# Patient Record
Sex: Male | Born: 1980
Health system: Southern US, Community
[De-identification: ages and names within clinical notes are randomized; demographics above are authoritative.]

## PROBLEM LIST (undated history)

## (undated) DIAGNOSIS — Z889 Allergy status to unspecified drugs, medicaments and biological substances status: Secondary | ICD-10-CM

## (undated) DIAGNOSIS — J301 Allergic rhinitis due to pollen: Secondary | ICD-10-CM

## (undated) DIAGNOSIS — J45909 Unspecified asthma, uncomplicated: Secondary | ICD-10-CM

## (undated) DIAGNOSIS — B019 Varicella without complication: Secondary | ICD-10-CM

## (undated) DIAGNOSIS — G4733 Obstructive sleep apnea (adult) (pediatric): Secondary | ICD-10-CM

## (undated) HISTORY — PX: TYMPANOSTOMY TUBE PLACEMENT: SHX32

## (undated) HISTORY — DX: Allergic rhinitis due to pollen: J30.1

## (undated) HISTORY — PX: ADENOIDECTOMY: SUR15

## (undated) HISTORY — DX: Allergy status to unspecified drugs, medicaments and biological substances: Z88.9

## (undated) HISTORY — DX: Varicella without complication: B01.9

## (undated) HISTORY — DX: Obstructive sleep apnea (adult) (pediatric): G47.33

## (undated) HISTORY — DX: Unspecified asthma, uncomplicated: J45.909

---

## 2012-10-04 LAB — CBC AND DIFFERENTIAL
HCT: 46 % (ref 41–53)
HEMOGLOBIN: 16.9 g/dL (ref 13.5–17.5)
Platelets: 234 10*3/uL (ref 150–399)
WBC: 6.6 10^3/mL

## 2012-10-04 LAB — HEPATIC FUNCTION PANEL
ALT: 25 U/L (ref 10–40)
AST: 19 U/L (ref 14–40)
Alkaline Phosphatase: 52 U/L (ref 25–125)
Bilirubin, Total: 1.3 mg/dL

## 2012-10-04 LAB — BASIC METABOLIC PANEL
BUN: 17 mg/dL (ref 4–21)
Creatinine: 0.9 mg/dL (ref 0.6–1.3)
POTASSIUM: 3.9 mmol/L (ref 3.4–5.3)
SODIUM: 139 mmol/L (ref 137–147)

## 2012-10-04 LAB — LIPID PANEL
CHOLESTEROL: 183 mg/dL (ref 0–200)
HDL: 38 mg/dL (ref 35–70)
LDL CALC: 125 mg/dL
Triglycerides: 102 mg/dL (ref 40–160)

## 2014-07-25 LAB — LIPID PANEL
Cholesterol: 143 mg/dL (ref 0–200)
HDL: 35 mg/dL (ref 35–70)
LDL CALC: 94 mg/dL
TRIGLYCERIDES: 68 mg/dL (ref 40–160)

## 2014-07-25 LAB — BASIC METABOLIC PANEL: GLUCOSE: 96 mg/dL

## 2015-09-02 ENCOUNTER — Encounter: Payer: Self-pay | Admitting: Family Medicine

## 2015-09-02 ENCOUNTER — Ambulatory Visit (INDEPENDENT_AMBULATORY_CARE_PROVIDER_SITE_OTHER): Payer: 59 | Admitting: Family Medicine

## 2015-09-02 VITALS — BP 122/80 | HR 70 | Temp 98.1°F | Ht 72.5 in | Wt 199.1 lb

## 2015-09-02 DIAGNOSIS — F900 Attention-deficit hyperactivity disorder, predominantly inattentive type: Secondary | ICD-10-CM | POA: Diagnosis not present

## 2015-09-02 DIAGNOSIS — Z0000 Encounter for general adult medical examination without abnormal findings: Secondary | ICD-10-CM

## 2015-09-02 DIAGNOSIS — J4520 Mild intermittent asthma, uncomplicated: Secondary | ICD-10-CM | POA: Diagnosis not present

## 2015-09-02 DIAGNOSIS — J45909 Unspecified asthma, uncomplicated: Secondary | ICD-10-CM | POA: Insufficient documentation

## 2015-09-02 DIAGNOSIS — F988 Other specified behavioral and emotional disorders with onset usually occurring in childhood and adolescence: Secondary | ICD-10-CM | POA: Insufficient documentation

## 2015-09-02 NOTE — Patient Instructions (Signed)
It was nice to see you today.  Follow up annually.  I will refill your medication once I get the records.  Take care  Dr. Adriana Simas

## 2015-09-02 NOTE — Progress Notes (Signed)
Pre visit review using our clinic review tool, if applicable. No additional management support is needed unless otherwise documented below in the visit note. 

## 2015-09-02 NOTE — Assessment & Plan Note (Signed)
Will obtain records from PCP including labs. Patient declined flu shot, and Pneumovax today. Remainder of preventative healthcare up to date.

## 2015-09-02 NOTE — Assessment & Plan Note (Signed)
Patient has recently relocated from [place]. He has had no issues with his asthma since relocating here. Currently stable with PRN Albuterol.

## 2015-09-02 NOTE — Progress Notes (Signed)
Subjective:  Patient ID: Tyler Hobbs, male    DOB: 1981/06/01  Age: 34 y.o. MRN: [id number]  CC: Establish care  HPI Tyler Hobbs is a 34 y.o. male presents to the clinic today to establish care.  Preventative Healthcare  Immunizations: Declined flu shot and Pneumovax today.  Labs: Patient reports that he had labs done by his PCP less than a year ago. Will obtain records.  Exercise - Exercises regularly.   Alcohol use: See below.  Smoking/tobacco use: Former smoker.   STD/HIV testing: Not indicated.  Regular dental exams: Yes.   Wears seat belt: Yes.   PMH, Surgical Hx, Family Hx, Social History reviewed and updated as below. Past Medical History  Diagnosis Date  . Asthma   . Hx of bronchitis   . Chicken pox   . Hay fever   . Multiple allergies    Past Surgical History  Procedure Laterality Date  . Adenoidectomy    . Tympanostomy tube placement     No family history on file.  Social History  Substance Use Topics  . Smoking status: Former Smoker    Quit date: 09/01/2001  . Smokeless tobacco: Never Used  . Alcohol Use: 1.8 - 2.4 oz/week    3-4 Standard drinks or equivalent per week     Comment: both beer and liquor   Review of Systems  HENT:       Allergies.  All other systems negative.   Objective:   Today's Vitals: BP 122/80 mmHg  Pulse 70  Temp(Src) 98.1 F (36.7 C) (Oral)  Ht 6' 0.5" (1.842 m)  Wt 199 lb 2 oz (90.323 kg)  BMI 26.62 kg/m2  SpO2 97%  Physical Exam  Constitutional: He is oriented to person, place, and time. He appears well-developed and well-nourished. No distress.  HENT:  Head: Normocephalic and atraumatic.  Nose: Nose normal.  Mouth/Throat: Oropharynx is clear and moist. No oropharyngeal exudate.  Normal TM's bilaterally.   Eyes: Conjunctivae are normal. No scleral icterus.  Neck: Neck supple. No thyromegaly present.  Cardiovascular: Normal rate and regular rhythm.   No murmur heard. Pulmonary/Chest: Effort  normal and breath sounds normal. He has no wheezes. He has no rales.  Abdominal: Soft. He exhibits no distension. There is no tenderness. There is no rebound and no guarding.  Musculoskeletal: Normal range of motion. He exhibits no edema.  Lymphadenopathy:    He has no cervical adenopathy.  Neurological: He is alert and oriented to person, place, and time.  Skin: Skin is warm and dry. No rash noted.  Psychiatric: He has a normal mood and affect.  Vitals reviewed.   Assessment & Plan:   Problem List Items Addressed This Visit    Adult attention deficit disorder    Patient reports a history of ADD. He states that he was not diagnosed and treated as a child given stigma that accompany diagnosis years ago. Patient reports he was diagnosed by psychiatrist in college and has been on treatment since then. His prior PCP has been prescribing ADD medication. I will await medical records before refilling his Ritalin.      Asthma, chronic    Patient has recently relocated from [place]. He has had no issues with his asthma since relocating here. Currently stable with PRN Albuterol.       Preventative health care - Primary    Will obtain records from PCP including labs. Patient declined flu shot, and Pneumovax today. Remainder of preventative healthcare up to date.  Outpatient Encounter Prescriptions as of 09/02/2015  Medication Sig  . fluticasone (FLONASE) 50 MCG/ACT nasal spray Place 1 spray into both nostrils as needed for allergies or rhinitis.  Marland Kitchen loratadine-pseudoephedrine (CLARITIN-D 24 HOUR) 10-240 MG per 24 hr tablet Take 1 tablet by mouth as needed for allergies.  . methylphenidate (RITALIN) 10 MG tablet Take 10 mg by mouth 3 (three) times daily.  . Multiple Vitamins-Minerals (MULTIVITAMIN GUMMIES ADULTS) CHEW Chew by mouth 2 (two) times daily.   No facility-administered encounter medications on file as of 09/02/2015.    Follow-up: 1 year  Tommie Sams DO

## 2015-09-02 NOTE — Assessment & Plan Note (Signed)
Patient reports a history of ADD. He states that he was not diagnosed and treated as a child given stigma that accompany diagnosis years ago. Patient reports he was diagnosed by psychiatrist in college and has been on treatment since then. His prior PCP has been prescribing ADD medication. I will await medical records before refilling his Ritalin.

## 2015-09-09 ENCOUNTER — Encounter: Payer: Self-pay | Admitting: Family Medicine

## 2015-09-27 ENCOUNTER — Encounter: Payer: Self-pay | Admitting: Family Medicine

## 2015-09-28 ENCOUNTER — Other Ambulatory Visit: Payer: Self-pay | Admitting: Family Medicine

## 2015-09-28 ENCOUNTER — Telehealth: Payer: Self-pay

## 2015-09-28 MED ORDER — METHYLPHENIDATE HCL 10 MG PO TABS: 10.0000 mg | ORAL_TABLET | Freq: Three times a day (TID) | ORAL | Status: DC

## 2015-09-28 NOTE — Telephone Encounter (Signed)
Pt was called to let him know that rx were ready to be picked up at the office.

## 2015-09-29 ENCOUNTER — Other Ambulatory Visit: Payer: Self-pay | Admitting: Family Medicine

## 2015-09-29 MED ORDER — METHYLPHENIDATE HCL 10 MG PO TABS: ORAL_TABLET | ORAL | Status: DC

## 2015-10-12 ENCOUNTER — Encounter: Payer: Self-pay | Admitting: Family Medicine

## 2015-11-25 ENCOUNTER — Encounter: Payer: Self-pay | Admitting: Family Medicine

## 2015-11-26 ENCOUNTER — Other Ambulatory Visit: Payer: Self-pay | Admitting: Family Medicine

## 2015-11-26 DIAGNOSIS — F909 Attention-deficit hyperactivity disorder, unspecified type: Secondary | ICD-10-CM

## 2015-12-03 ENCOUNTER — Encounter: Payer: Self-pay | Admitting: Family Medicine

## 2015-12-07 ENCOUNTER — Other Ambulatory Visit: Payer: Self-pay | Admitting: Family Medicine

## 2015-12-07 ENCOUNTER — Telehealth: Payer: Self-pay | Admitting: *Deleted

## 2015-12-07 ENCOUNTER — Encounter: Payer: Self-pay | Admitting: Family Medicine

## 2015-12-07 ENCOUNTER — Telehealth: Payer: Self-pay | Admitting: Family Medicine

## 2015-12-07 MED ORDER — METHYLPHENIDATE HCL 10 MG PO TABS: ORAL_TABLET | ORAL | Status: DC

## 2015-12-07 NOTE — Telephone Encounter (Signed)
Patient was called and told his prescription was ready for pick up at the office.

## 2015-12-30 ENCOUNTER — Encounter: Payer: Self-pay | Admitting: Family Medicine

## 2015-12-31 ENCOUNTER — Other Ambulatory Visit: Payer: Self-pay | Admitting: Family Medicine

## 2015-12-31 DIAGNOSIS — G4733 Obstructive sleep apnea (adult) (pediatric): Secondary | ICD-10-CM

## 2016-04-26 ENCOUNTER — Other Ambulatory Visit: Payer: Self-pay | Admitting: Family Medicine

## 2016-04-26 ENCOUNTER — Encounter: Payer: Self-pay | Admitting: Family Medicine

## 2016-04-29 ENCOUNTER — Other Ambulatory Visit: Payer: Self-pay | Admitting: Family Medicine

## 2016-04-29 MED ORDER — METHYLPHENIDATE HCL 10 MG PO TABS: ORAL_TABLET | ORAL | Status: DC

## 2016-04-29 NOTE — Telephone Encounter (Signed)
Pt wife called to follow up on the Rx for pt medication. Pls call when it's ready to [phone]. Thank you!

## 2016-07-06 ENCOUNTER — Encounter: Payer: Self-pay | Admitting: Family Medicine

## 2016-07-08 ENCOUNTER — Other Ambulatory Visit: Payer: Self-pay | Admitting: Family Medicine

## 2016-07-08 MED ORDER — METHYLPHENIDATE HCL 10 MG PO TABS: ORAL_TABLET | ORAL | Status: DC

## 2016-08-01 ENCOUNTER — Encounter: Payer: Self-pay | Admitting: Family Medicine

## 2016-08-11 ENCOUNTER — Encounter: Payer: Self-pay | Admitting: Family Medicine

## 2016-08-22 ENCOUNTER — Other Ambulatory Visit: Payer: Self-pay | Admitting: Family Medicine

## 2016-08-22 MED ORDER — METHYLPHENIDATE HCL 10 MG PO TABS: ORAL_TABLET | ORAL | 0 refills | Status: DC

## 2016-09-13 ENCOUNTER — Telehealth: Payer: Self-pay | Admitting: *Deleted

## 2016-09-13 NOTE — Telephone Encounter (Signed)
[employer] requested an updated Rx for [employer]ol  Fax [phone]

## 2016-09-13 NOTE — Telephone Encounter (Signed)
[employer] has requested to have a updater Rx for methylphenidate from 90 to 120 sent to cover my meds. Fax [phone]

## 2016-09-13 NOTE — Telephone Encounter (Signed)
This was done already for that number of pills I am not sure what else I can do? Thanks I never got a response to if it was approved or not.

## 2016-09-13 NOTE — Telephone Encounter (Signed)
Please clarify this message.

## 2016-09-14 ENCOUNTER — Other Ambulatory Visit: Payer: Self-pay | Admitting: Family Medicine

## 2016-09-14 MED ORDER — METHYLPHENIDATE HCL 10 MG PO TABS: ORAL_TABLET | ORAL | 0 refills | Status: DC

## 2016-09-14 NOTE — Telephone Encounter (Signed)
Refilled 08/22/16. Pt last seen 09/02/15. Please advise if pt needs to come in for a visit?

## 2016-09-14 NOTE — Telephone Encounter (Signed)
Okay; thanks.

## 2016-09-15 NOTE — Telephone Encounter (Signed)
Placed up front for pickup ° °

## 2016-10-19 ENCOUNTER — Other Ambulatory Visit: Payer: Self-pay | Admitting: Family Medicine

## 2016-10-19 MED ORDER — METHYLPHENIDATE HCL 10 MG PO TABS: ORAL_TABLET | ORAL | 0 refills | Status: DC

## 2016-10-19 NOTE — Telephone Encounter (Signed)
MyChart message sent to Pt.

## 2016-10-19 NOTE — Telephone Encounter (Signed)
Last filled 04/29/16. Pt has not been seen since 08/2015. Pt has no follow up visit scheduled.

## 2016-10-26 MED FILL — METHYLPHENIDATE 10 MG TAB: 10 | 30 days supply | Qty: 120 | Fill #0

## 2016-11-07 ENCOUNTER — Encounter: Payer: Self-pay | Admitting: Family Medicine

## 2016-11-14 ENCOUNTER — Ambulatory Visit (INDEPENDENT_AMBULATORY_CARE_PROVIDER_SITE_OTHER): Payer: 59 | Admitting: Family Medicine

## 2016-11-14 ENCOUNTER — Encounter: Payer: Self-pay | Admitting: Family Medicine

## 2016-11-14 VITALS — BP 129/89 | HR 78 | Temp 98.1°F | Resp 14 | Wt 199.4 lb

## 2016-11-14 DIAGNOSIS — G4733 Obstructive sleep apnea (adult) (pediatric): Secondary | ICD-10-CM | POA: Insufficient documentation

## 2016-11-14 DIAGNOSIS — Z0000 Encounter for general adult medical examination without abnormal findings: Secondary | ICD-10-CM

## 2016-11-14 MED ORDER — METHYLPHENIDATE HCL 10 MG PO TABS: ORAL_TABLET | ORAL | 0 refills | Status: DC

## 2016-11-14 NOTE — Patient Instructions (Addendum)
Follow up annually.  Take care  Dr. Cassidie Veiga   Health Maintenance, Male A healthy lifestyle and preventative care can promote health and wellness.  Maintain regular health, dental, and eye exams.  Eat a healthy diet. Foods like vegetables, fruits, whole grains, low-fat dairy products, and lean protein foods contain the nutrients you need and are low in calories. Decrease your intake of foods high in solid fats, added sugars, and salt. Get information about a proper diet from your health care provider, if necessary.  Regular physical exercise is one of the most important things you can do for your health. Most adults should get at least 150 minutes of moderate-intensity exercise (any activity that increases your heart rate and causes you to sweat) each week. In addition, most adults need muscle-strengthening exercises on 2 or more days a week.   Maintain a healthy weight. The body mass index (BMI) is a screening tool to identify possible weight problems. It provides an estimate of body fat based on height and weight. Your health care provider can find your BMI and can help you achieve or maintain a healthy weight. For males 20 years and older:  A BMI below 18.5 is considered underweight.  A BMI of 18.5 to 24.9 is normal.  A BMI of 25 to 29.9 is considered overweight.  A BMI of 30 and above is considered obese.  Maintain normal blood lipids and cholesterol by exercising and minimizing your intake of saturated fat. Eat a balanced diet with plenty of fruits and vegetables. Blood tests for lipids and cholesterol should begin at age 20 and be repeated every 5 years. If your lipid or cholesterol levels are high, you are over age 50, or you are at high risk for heart disease, you may need your cholesterol levels checked more frequently.Ongoing high lipid and cholesterol levels should be treated with medicines if diet and exercise are not working.  If you smoke, find out from your health care  provider how to quit. If you do not use tobacco, do not start.  Lung cancer screening is recommended for adults aged 55-80 years who are at high risk for developing lung cancer because of a history of smoking. A yearly low-dose CT scan of the lungs is recommended for people who have at least a 30-pack-year history of smoking and are current smokers or have quit within the past 15 years. A pack year of smoking is smoking an average of 1 pack of cigarettes a day for 1 year (for example, a 30-pack-year history of smoking could mean smoking 1 pack a day for 30 years or 2 packs a day for 15 years). Yearly screening should continue until the smoker has stopped smoking for at least 15 years. Yearly screening should be stopped for people who develop a health problem that would prevent them from having lung cancer treatment.  If you choose to drink alcohol, do not have more than 2 drinks per day. One drink is considered to be 12 oz (360 mL) of beer, 5 oz (150 mL) of wine, or 1.5 oz (45 mL) of liquor.  Avoid the use of street drugs. Do not share needles with anyone. Ask for help if you need support or instructions about stopping the use of drugs.  High blood pressure causes heart disease and increases the risk of stroke. High blood pressure is more likely to develop in:  People who have blood pressure in the end of the normal range (100-139/85-89 mm Hg).  People who   are overweight or obese.  People who are African American.  If you are 18-39 years of age, have your blood pressure checked every 3-5 years. If you are 40 years of age or older, have your blood pressure checked every year. You should have your blood pressure measured twice-once when you are at a hospital or clinic, and once when you are not at a hospital or clinic. Record the average of the two measurements. To check your blood pressure when you are not at a hospital or clinic, you can use:  An automated blood pressure machine at a pharmacy.  A  home blood pressure monitor.  If you are 45-79 years old, ask your health care provider if you should take aspirin to prevent heart disease.  Diabetes screening involves taking a blood sample to check your fasting blood sugar level. This should be done once every 3 years after age 45 if you are at a normal weight and without risk factors for diabetes. Testing should be considered at a younger age or be carried out more frequently if you are overweight and have at least 1 risk factor for diabetes.  Colorectal cancer can be detected and often prevented. Most routine colorectal cancer screening begins at the age of 50 and continues through age 75. However, your health care provider may recommend screening at an earlier age if you have risk factors for colon cancer. On a yearly basis, your health care provider may provide home test kits to check for hidden blood in the stool. A small camera at the end of a tube may be used to directly examine the colon (sigmoidoscopy or colonoscopy) to detect the earliest forms of colorectal cancer. Talk to your health care provider about this at age 50 when routine screening begins. A direct exam of the colon should be repeated every 5-10 years through age 75, unless early forms of precancerous polyps or small growths are found.  People who are at an increased risk for hepatitis B should be screened for this virus. You are considered at high risk for hepatitis B if:  You were born in a country where hepatitis B occurs often. Talk with your health care provider about which countries are considered high risk.  Your parents were born in a high-risk country and you have not received a shot to protect against hepatitis B (hepatitis B vaccine).  You have HIV or AIDS.  You use needles to inject street drugs.  You live with, or have sex with, someone who has hepatitis B.  You are a man who has sex with other men (MSM).  You get hemodialysis treatment.  You take certain  medicines for conditions like cancer, organ transplantation, and autoimmune conditions.  Hepatitis C blood testing is recommended for all people born from 1945 through 1965 and any individual with known risk factors for hepatitis C.  Healthy men should no longer receive prostate-specific antigen (PSA) blood tests as part of routine cancer screening. Talk to your health care provider about prostate cancer screening.  Testicular cancer screening is not recommended for adolescents or adult males who have no symptoms. Screening includes self-exam, a health care provider exam, and other screening tests. Consult with your health care provider about any symptoms you have or any concerns you have about testicular cancer.  Practice safe sex. Use condoms and avoid high-risk sexual practices to reduce the spread of sexually transmitted infections (STIs).  You should be screened for STIs, including gonorrhea and chlamydia if:  You   are sexually active and are younger than 24 years.  You are older than 24 years, and your health care provider tells you that you are at risk for this type of infection.  Your sexual activity has changed since you were last screened, and you are at an increased risk for chlamydia or gonorrhea. Ask your health care provider if you are at risk.  If you are at risk of being infected with HIV, it is recommended that you take a prescription medicine daily to prevent HIV infection. This is called pre-exposure prophylaxis (PrEP). You are considered at risk if:  You are a man who has sex with other men (MSM).  You are a heterosexual man who is sexually active with multiple partners.  You take drugs by injection.  You are sexually active with a partner who has HIV.  Talk with your health care provider about whether you are at high risk of being infected with HIV. If you choose to begin PrEP, you should first be tested for HIV. You should then be tested every 3 months for as long as  you are taking PrEP.  Use sunscreen. Apply sunscreen liberally and repeatedly throughout the day. You should seek shade when your shadow is shorter than you. Protect yourself by wearing long sleeves, pants, a wide-brimmed hat, and sunglasses year round whenever you are outdoors.  Tell your health care provider of new moles or changes in moles, especially if there is a change in shape or color. Also, tell your health care provider if a mole is larger than the size of a pencil eraser.  A one-time screening for abdominal aortic aneurysm (AAA) and surgical repair of large AAAs by ultrasound is recommended for men aged 65-75 years who are current or former smokers.  Stay current with your vaccines (immunizations). This information is not intended to replace advice given to you by your health care provider. Make sure you discuss any questions you have with your health care provider. Document Released: 06/09/2008 Document Revised: 01/02/2015 Document Reviewed: 09/15/2015 Elsevier Interactive Patient Education  2017 [employer]  

## 2016-11-14 NOTE — Assessment & Plan Note (Signed)
Preventative healthcare up-to-date. Patient in need of new CPAP. Written prescription given today. Patient in need of refill on Ritalin. Refills given today.

## 2016-11-14 NOTE — Progress Notes (Signed)
Subjective:  Patient ID: Tyler Hobbs, male    DOB: 03/12/1981  Age: 35 y.o. MRN: [id number]  CC: Annual physical exam  HPI Tyler Hobbs is a 35 y.o. male presents to the clinic today for an annual physical exam.  Preventative Healthcare  Immunizations  Tetanus - Up to date.   Flu - Up to date.   Labs: Needs labs today.   Alcohol use: See below.  Smoking/tobacco use: Former.  PMH, Surgical Hx, Family Hx, Social History reviewed and updated as below.  Past Medical History:  Diagnosis Date  . Asthma   . Chicken pox   . Hay fever   . Multiple allergies   . OSA (obstructive sleep apnea)    Past Surgical History:  Procedure Laterality Date  . ADENOIDECTOMY    . TYMPANOSTOMY TUBE PLACEMENT     Family History  Problem Relation Age of Onset  . Hypertension Father   . Cancer Maternal Aunt     lung  . Cancer Paternal Aunt     breast  . Alcohol abuse Paternal Uncle   . Arthritis Maternal Grandfather    Social History  Substance Use Topics  . Smoking status: Former Smoker    Quit date: 09/01/2001  . Smokeless tobacco: Never Used  . Alcohol use 1.8 - 2.4 oz/week    3 - 4 Standard drinks or equivalent per week     Comment: both beer and liquor   Review of Systems  Respiratory:       OSA - on CPAP.  Psychiatric/Behavioral:       Concentration deficit, inattention - stable.   All other systems reviewed and are negative.   Objective:   Today's Vitals: BP 129/89 (BP Location: Left Arm, Patient Position: Sitting, Cuff Size: Normal)   Pulse 78   Temp 98.1 F (36.7 C) (Oral)   Resp 14   Wt 199 lb 6 oz (90.4 kg)   SpO2 100%   BMI 26.67 kg/m   Physical Exam  Constitutional: He is oriented to person, place, and time. He appears well-developed and well-nourished. No distress.  HENT:  Head: Normocephalic and atraumatic.  Nose: Nose normal.  Mouth/Throat: Oropharynx is clear and moist. No oropharyngeal exudate.  Normal TM's bilaterally.   Eyes:  Conjunctivae are normal. No scleral icterus.  Neck: Neck supple.  Cardiovascular: Normal rate and regular rhythm.   No murmur heard. Pulmonary/Chest: Effort normal and breath sounds normal. He has no wheezes. He has no rales.  Abdominal: Soft. He exhibits no distension. There is no tenderness. There is no rebound and no guarding.  Musculoskeletal: Normal range of motion. He exhibits no edema.  Lymphadenopathy:    He has no cervical adenopathy.  Neurological: He is alert and oriented to person, place, and time.  Skin: Skin is warm and dry. No rash noted.  Psychiatric: He has a normal mood and affect.  Vitals reviewed.  Assessment & Plan:   Problem List Items Addressed This Visit    Annual physical exam - Primary    Preventative healthcare up-to-date. Patient in need of new CPAP. Written prescription given today. Patient in need of refill on Ritalin. Refills given today.      Relevant Orders   CBC   Hemoglobin A1c   Lipid panel   Comprehensive metabolic panel      Outpatient Encounter Prescriptions as of 11/14/2016  Medication Sig  . fluticasone (FLONASE) 50 MCG/ACT nasal spray Place 1 spray into both nostrils as needed for allergies or  rhinitis.  Marland Kitchen. loratadine-pseudoephedrine (CLARITIN-D 24 HOUR) 10-240 MG per 24 hr tablet Take 1 tablet by mouth as needed for allergies.  . methylphenidate (RITALIN) 10 MG tablet 1.5 tablets in the am, 1.5 tablets at noon, and 1 tablet in the evening.  . methylphenidate (RITALIN) 10 MG tablet 1.5 tablets in the morning, 1.5 tablets at noon and 1 tablet in the evening.  . methylphenidate (RITALIN) 10 MG tablet 1.5 tablets in the morning, 1.5 tablets at noon and 1 tablet in the evening.  . Multiple Vitamins-Minerals (MULTIVITAMIN GUMMIES ADULTS) CHEW Chew by mouth 2 (two) times daily.  . [DISCONTINUED] methylphenidate (RITALIN) 10 MG tablet 1.5 tablets in the am, 1.5 tablets at noon, and 1 tablet in the evening.  . [DISCONTINUED] methylphenidate  (RITALIN) 10 MG tablet 1.5 tablets in the morning, 1.5 tablets at noon and 1 tablet in the evening.  . [DISCONTINUED] methylphenidate (RITALIN) 10 MG tablet 1.5 tablets in the morning, 1.5 tablets at noon and 1 tablet in the evening.   No facility-administered encounter medications on file as of 11/14/2016.     Follow-up: Annually  Everlene OtherJayce Ladale Sherburn DO [facility] [place]

## 2016-11-15 LAB — COMPREHENSIVE METABOLIC PANEL
ALBUMIN: 4.6 g/dL (ref 3.5–5.2)
ALT: 18 U/L (ref 0–53)
AST: 19 U/L (ref 0–37)
Alkaline Phosphatase: 44 U/L (ref 39–117)
BILIRUBIN TOTAL: 0.6 mg/dL (ref 0.2–1.2)
BUN: 20 mg/dL (ref 6–23)
CALCIUM: 9.7 mg/dL (ref 8.4–10.5)
CO2: 31 mEq/L (ref 19–32)
CREATININE: 0.92 mg/dL (ref 0.40–1.50)
Chloride: 102 mEq/L (ref 96–112)
GFR: 98.01 mL/min (ref >60.00)
Glucose, Bld: 102 mg/dL — ABNORMAL HIGH (ref 70–99)
Potassium: 4.2 mEq/L (ref 3.5–5.1)
Sodium: 139 mEq/L (ref 135–145)
Total Protein: 7 g/dL (ref 6.0–8.3)

## 2016-11-15 LAB — CBC
HCT: 45.1 % (ref 39.0–52.0)
HEMOGLOBIN: 15.6 g/dL (ref 13.0–17.0)
MCHC: 34.6 g/dL (ref 30.0–36.0)
MCV: 91.9 fl (ref 78.0–100.0)
PLATELETS: 217 10*3/uL (ref 150.0–400.0)
RBC: 4.91 Mil/uL (ref 4.22–5.81)
RDW: 12.7 % (ref 11.5–15.5)
WBC: 8.5 10*3/uL (ref 4.0–10.5)

## 2016-11-15 LAB — LIPID PANEL
CHOLESTEROL: 166 mg/dL (ref 0–200)
HDL: 45.6 mg/dL (ref >39.00)
LDL CALC: 98 mg/dL (ref 0–99)
NonHDL: 120.11
TRIGLYCERIDES: 111 mg/dL (ref 0.0–149.0)
Total CHOL/HDL Ratio: 4
VLDL: 22.2 mg/dL (ref 0.0–40.0)

## 2016-11-15 LAB — HEMOGLOBIN A1C: Hgb A1c MFr Bld: 5 % (ref 4.6–6.5)

## 2016-11-23 MED FILL — METHYLPHENIDATE 10 MG TAB: 10 | 30 days supply | Qty: 120 | Fill #0

## 2016-11-28 ENCOUNTER — Encounter: Payer: Self-pay | Admitting: Family Medicine

## 2016-12-21 MED FILL — METHYLPHENIDATE 10 MG TAB: 10 | 30 days supply | Qty: 120 | Fill #0

## 2017-01-18 MED FILL — METHYLPHENIDATE 10 MG TAB: 10 | 30 days supply | Qty: 120 | Fill #0

## 2017-02-07 ENCOUNTER — Other Ambulatory Visit: Payer: Self-pay | Admitting: Family Medicine

## 2017-02-09 MED ORDER — METHYLPHENIDATE HCL 10 MG PO TABS: ORAL_TABLET | ORAL | 0 refills | Status: DC

## 2017-02-09 NOTE — Telephone Encounter (Signed)
MyChart message sent to pt letting him know rx was ready.

## 2017-02-09 NOTE — Telephone Encounter (Signed)
Refilled 11/14/16. Pt last seen 11/14/16. Please advise? 

## 2017-02-15 MED FILL — METHYLPHENIDATE 10 MG TAB: 10 | 30 days supply | Qty: 120 | Fill #0

## 2017-02-21 ENCOUNTER — Telehealth: Payer: Self-pay | Admitting: Family Medicine

## 2017-02-21 NOTE — Telephone Encounter (Signed)
Pt sent a e-mail on 02/16/17 stating that a PA would be required come the month of March for his ritalin. Pt faxed over PA form. It was completed 02/16/17. PA decision was received today 02/21/17 and it was approved from 02/20/17-02/19/2018. Pt was notified via his MyChart.

## 2017-03-08 ENCOUNTER — Other Ambulatory Visit: Payer: Self-pay | Admitting: Family Medicine

## 2017-03-09 MED ORDER — METHYLPHENIDATE HCL 10 MG PO TABS: ORAL_TABLET | ORAL | 0 refills | Status: DC

## 2017-03-09 NOTE — Telephone Encounter (Signed)
Refilled: 02/09/17 Last OV: 11/14/16 Last Labs: 11/14/16 Future OV: none Please advise?

## 2017-03-09 NOTE — Telephone Encounter (Signed)
MyChart message sent to pt

## 2017-03-15 MED FILL — METHYLPHENIDATE 10 MG TAB: 10 | 30 days supply | Qty: 120 | Fill #0

## 2017-04-10 ENCOUNTER — Other Ambulatory Visit: Payer: Self-pay | Admitting: Family Medicine

## 2017-04-10 ENCOUNTER — Encounter: Payer: Self-pay | Admitting: Family Medicine

## 2017-04-10 MED ORDER — METHYLPHENIDATE HCL 10 MG PO TABS: ORAL_TABLET | ORAL | 0 refills | Status: DC

## 2017-04-10 NOTE — Telephone Encounter (Signed)
Refilled: 02/2017 Last OV: 11/14/16 Last Labs: 11/14/16 Future OV: none Please advise?

## 2017-04-10 NOTE — Telephone Encounter (Signed)
Pt notified via My Chart

## 2017-04-12 MED FILL — METHYLPHENIDATE 10 MG TAB: 10 | 30 days supply | Qty: 120 | Fill #0

## 2017-05-01 ENCOUNTER — Other Ambulatory Visit: Payer: Self-pay | Admitting: Family Medicine

## 2017-05-01 MED ORDER — METHYLPHENIDATE HCL 10 MG PO TABS: ORAL_TABLET | ORAL | 0 refills | Status: DC

## 2017-05-01 NOTE — Telephone Encounter (Signed)
MyChart message sent to pt to pick up at office.

## 2017-05-01 NOTE — Telephone Encounter (Signed)
Refilled: 03/31/17 Last OV: 11/14/16 Last Labs: 11/14/16 Future OV: none Please advise?

## 2017-05-12 MED FILL — METHYLPHENIDATE 10 MG TAB: 10 | 30 days supply | Qty: 120 | Fill #0

## 2017-07-12 MED FILL — METHYLPHENIDATE 10 MG TAB: 10 | 30 days supply | Qty: 120 | Fill #0

## 2017-07-31 ENCOUNTER — Other Ambulatory Visit: Payer: Self-pay | Admitting: Family Medicine

## 2017-07-31 NOTE — Telephone Encounter (Signed)
Last rx's given on 05/01/17, for three months worth, please advise, thanks

## 2017-08-01 ENCOUNTER — Other Ambulatory Visit: Payer: Self-pay | Admitting: Family Medicine

## 2017-08-01 MED ORDER — METHYLPHENIDATE HCL 10 MG PO TABS: ORAL_TABLET | ORAL | 0 refills | Status: DC

## 2017-08-03 ENCOUNTER — Other Ambulatory Visit: Payer: Self-pay | Admitting: Family Medicine

## 2017-08-03 ENCOUNTER — Encounter: Payer: Self-pay | Admitting: Family Medicine

## 2017-08-09 MED FILL — METHYLPHENIDATE 10 MG TAB: 10 | 30 days supply | Qty: 120 | Fill #0

## 2017-09-26 ENCOUNTER — Encounter: Payer: Self-pay | Admitting: Family Medicine

## 2017-10-17 ENCOUNTER — Encounter: Payer: Self-pay | Admitting: Family

## 2017-10-17 ENCOUNTER — Ambulatory Visit (INDEPENDENT_AMBULATORY_CARE_PROVIDER_SITE_OTHER): Payer: BLUE CROSS/BLUE SHIELD | Admitting: Family

## 2017-10-17 VITALS — BP 130/80 | HR 71 | Temp 98.1°F | Ht 72.0 in | Wt 203.0 lb

## 2017-10-17 DIAGNOSIS — Z0000 Encounter for general adult medical examination without abnormal findings: Secondary | ICD-10-CM

## 2017-10-17 DIAGNOSIS — F988 Other specified behavioral and emotional disorders with onset usually occurring in childhood and adolescence: Secondary | ICD-10-CM

## 2017-10-17 DIAGNOSIS — G4733 Obstructive sleep apnea (adult) (pediatric): Secondary | ICD-10-CM

## 2017-10-17 MED ORDER — METHYLPHENIDATE HCL 10 MG PO TABS: ORAL_TABLET | ORAL | 0 refills | Status: DC

## 2017-10-17 NOTE — Assessment & Plan Note (Addendum)
Declines testicular exam in the absence of symptoms.screening labs to be done at work. Encouraged continued exercise.

## 2017-10-17 NOTE — Progress Notes (Signed)
Pre visit review using our clinic review tool, if applicable. No additional management support is needed unless otherwise documented below in the visit note. 

## 2017-10-17 NOTE — Assessment & Plan Note (Signed)
Compliant with cipap.  

## 2017-10-17 NOTE — Patient Instructions (Addendum)
Physical  Labs-   Lipid panel, CMP, CBC, TSH, Vitamin D , A1C.   Last tetanus vaccine - confirm 2012?   Follow up 6 months   Health Maintenance, Male A healthy lifestyle and preventive care is important for your health and wellness. Ask your health care provider about what schedule of regular examinations is right for you. What should I know about weight and diet? Eat a Healthy Diet  Eat plenty of vegetables, fruits, whole grains, low-fat dairy products, and lean protein.  Do not eat a lot of foods high in solid fats, added sugars, or salt.  Maintain a Healthy Weight Regular exercise can help you achieve or maintain a healthy weight. You should:  Do at least 150 minutes of exercise each week. The exercise should increase your heart rate and make you sweat (moderate-intensity exercise).  Do strength-training exercises at least twice a week.  Watch Your Levels of Cholesterol and Blood Lipids  Have your blood tested for lipids and cholesterol every 5 years starting at 38 years of age. If you are at high risk for heart disease, you should start having your blood tested when you are 38 years old. You may need to have your cholesterol levels checked more often if: ? Your lipid or cholesterol levels are high. ? You are older than 38 years of age. ? You are at high risk for heart disease.  What should I know about cancer screening? Many types of cancers can be detected early and may often be prevented. Lung Cancer  You should be screened every year for lung cancer if: ? You are a current smoker who has smoked for at least 30 years. ? You are a former smoker who has quit within the past 15 years.  Talk to your health care provider about your screening options, when you should start screening, and how often you should be screened.  Colorectal Cancer  Routine colorectal cancer screening usually begins at 38 years of age and should be repeated every 5-10 years until you are 38 years  old. You may need to be screened more often if early forms of precancerous polyps or small growths are found. Your health care provider may recommend screening at an earlier age if you have risk factors for colon cancer.  Your health care provider may recommend using home test kits to check for hidden blood in the stool.  A small camera at the end of a tube can be used to examine your colon (sigmoidoscopy or colonoscopy). This checks for the earliest forms of colorectal cancer.  Prostate and Testicular Cancer  Depending on your age and overall health, your health care provider may do certain tests to screen for prostate and testicular cancer.  Talk to your health care provider about any symptoms or concerns you have about testicular or prostate cancer.  Skin Cancer  Check your skin from head to toe regularly.  Tell your health care provider about any new moles or changes in moles, especially if: ? There is a change in a mole's size, shape, or color. ? You have a mole that is larger than a pencil eraser.  Always use sunscreen. Apply sunscreen liberally and repeat throughout the day.  Protect yourself by wearing long sleeves, pants, a wide-brimmed hat, and sunglasses when outside.  What should I know about heart disease, diabetes, and high blood pressure?  If you are 60-52 years of age, have your blood pressure checked every 3-5 years. If you are 40 years  of age or older, have your blood pressure checked every year. You should have your blood pressure measured twice-once when you are at a hospital or clinic, and once when you are not at a hospital or clinic. Record the average of the two measurements. To check your blood pressure when you are not at a hospital or clinic, you can use: ? An automated blood pressure machine at a pharmacy. ? A home blood pressure monitor.  Talk to your health care provider about your target blood pressure.  If you are between 2045-38 years old, ask your  health care provider if you should take aspirin to prevent heart disease.  Have regular diabetes screenings by checking your fasting blood sugar level. ? If you are at a normal weight and have a low risk for diabetes, have this test once every three years after the age of 38. ? If you are overweight and have a high risk for diabetes, consider being tested at a younger age or more often.  A one-time screening for abdominal aortic aneurysm (AAA) by ultrasound is recommended for men aged 65-75 years who are current or former smokers. What should I know about preventing infection? Hepatitis B If you have a higher risk for hepatitis B, you should be screened for this virus. Talk with your health care provider to find out if you are at risk for hepatitis B infection. Hepatitis C Blood testing is recommended for:  Everyone born from [id number] through 1965.  Anyone with known risk factors for hepatitis C.  Sexually Transmitted Diseases (STDs)  You should be screened each year for STDs including gonorrhea and chlamydia if: ? You are sexually active and are younger than 38 years of age. ? You are older than 38 years of age and your health care provider tells you that you are at risk for this type of infection. ? Your sexual activity has changed since you were last screened and you are at an increased risk for chlamydia or gonorrhea. Ask your health care provider if you are at risk.  Talk with your health care provider about whether you are at high risk of being infected with HIV. Your health care provider may recommend a prescription medicine to help prevent HIV infection.  What else can I do?  Schedule regular health, dental, and eye exams.  Stay current with your vaccines (immunizations).  Do not use any tobacco products, such as cigarettes, chewing tobacco, and e-cigarettes. If you need help quitting, ask your health care provider.  Limit alcohol intake to no more than 2 drinks per day. One  drink equals 12 ounces of beer, 5 ounces of wine, or 1 ounces of hard liquor.  Do not use street drugs.  Do not share needles.  Ask your health care provider for help if you need support or information about quitting drugs.  Tell your health care provider if you often feel depressed.  Tell your health care provider if you have ever been abused or do not feel safe at home. This information is not intended to replace advice given to you by your health care provider. Make sure you discuss any questions you have with your health care provider. Document Released: 06/09/2008 Document Revised: 08/10/2016 Document Reviewed: 09/15/2015 Elsevier Interactive Patient Education  [employer]2018 Elsevier Inc.

## 2017-10-17 NOTE — Progress Notes (Signed)
Subjective:    Tyler Hobbs ID: Tyler Hobbs, male    DOB: [birth year], 36 y.o.   MRN: [id number]  CC: Tyler Hobbs is a 36 y.o. male who presents Hobbs for physical exam.    HPI: ADD- formally diagnosed in college and then again, [facility] 01/2015 Ritalin works well. No palpitations, chest  Pain, increased anxiety, trouble sleeping  Tyler Hobbs, no complaints Hobbs.   No cardiac history.   OSA- wearing cipap       Colorectal  Cancer Screening: No early family  History.  Prostate Cancer Screening: Has been discussed with Tyler Hobbs; Tyler Hobbs declined following PSA based on risks versus benefits. No prostate cancer family history.  Lung Cancer Screening: No 30 year pack year history and > 55 years.  Immunizations       Tetanus - unsure, thinks 2012         HIV Screening- Candidate for; declines Labs: Screening labs Hobbs. Exercise: Gets regular exercise.  Alcohol use: occasional Smoking/tobacco use: Nonsmoker.  Regular dental exams: UTD Wears seat belt: Yes. Skin: no new skin lesions; no h/o skin cancer   HISTORY:  Past Medical History:  Diagnosis Date  . Asthma   . Chicken pox   . Hay fever   . Multiple allergies   . OSA (obstructive sleep apnea)     Past Surgical History:  Procedure Laterality Date  . ADENOIDECTOMY    . TYMPANOSTOMY TUBE PLACEMENT     Family History  Problem Relation Age of Onset  . Hypertension Father   . Cancer Maternal Aunt        lung  . Cancer Paternal Aunt        breast  . Alcohol abuse Paternal Uncle   . Arthritis Maternal Grandfather   . Colon cancer Neg Hx   . Prostate cancer Neg Hx       ALLERGIES: Tyler Hobbs has no known allergies.  Current Outpatient Prescriptions on File Prior to Visit  Medication Sig Dispense Refill  . fluticasone (FLONASE) 50 MCG/ACT nasal spray Place 1 spray into both nostrils as needed for allergies or rhinitis.    Marland Kitchen loratadine-pseudoephedrine (CLARITIN-D 24 HOUR) 10-240 MG  per 24 hr tablet Take 1 tablet by mouth as needed for allergies.    . Multiple Vitamins-Minerals (MULTIVITAMIN GUMMIES ADULTS) CHEW Chew by mouth 2 (two) times daily.     No current facility-administered medications on file prior to visit.     Social History  Substance Use Topics  . Smoking status: Former Smoker    Quit date: 09/01/2001  . Smokeless tobacco: Never Used  . Alcohol use 1.8 - 2.4 oz/week    3 - 4 Standard drinks or equivalent per week     Comment: both beer and liquor    Review of Systems  Constitutional: Negative for chills and fever.  HENT: Negative for congestion.   Respiratory: Negative for cough.   Cardiovascular: Negative for chest pain, palpitations and leg swelling.  Gastrointestinal: Negative for diarrhea, nausea and vomiting.  Genitourinary: Negative for difficulty urinating, dysuria, hematuria and testicular pain.  Musculoskeletal: Negative for myalgias.  Skin: Negative for rash.  Neurological: Negative for headaches.  Hematological: Negative for adenopathy.  Psychiatric/Behavioral: Negative for confusion.      Objective:    BP 130/80   Pulse 71   Temp 98.1 F (36.7 C) (Oral)   Ht 6' (1.829 m)   Wt 203 lb (92.1 kg)   SpO2 98%   BMI 27.53 kg/m  BP Readings from Last 3 Encounters:  10/17/17 130/80  11/14/16 129/89  09/02/15 122/80   Wt Readings from Last 3 Encounters:  10/17/17 203 lb (92.1 kg)  11/14/16 199 lb 6 oz (90.4 kg)  09/02/15 199 lb 2 oz (90.3 kg)    Physical Exam  Constitutional: Tyler Hobbs appears well-developed and well-nourished.  Neck: No thyroid mass and no thyromegaly present.  Cardiovascular: Regular rhythm and normal heart sounds.   Pulmonary/Chest: Effort normal and breath sounds normal. No respiratory distress. Tyler Hobbs has no wheezes. Tyler Hobbs has no rhonchi. Tyler Hobbs has no rales.  Lymphadenopathy:       Head (right side): No submental, no submandibular, no tonsillar, no preauricular, no posterior auricular and no occipital adenopathy  present.       Head (left side): No submental, no submandibular, no tonsillar, no preauricular, no posterior auricular and no occipital adenopathy present.    Tyler Hobbs has no cervical adenopathy.    Tyler Hobbs has no axillary adenopathy.  Neurological: Tyler Hobbs is alert.  Skin: Skin is warm and dry.  Psychiatric: Tyler Hobbs has a normal mood and affect. His speech is normal and behavior is normal.  Vitals reviewed.      Assessment & Plan:   Problem List Items Addressed This Visit      Respiratory   OSA (obstructive sleep apnea)    Compliant with cipap.        Other   Adult attention deficit disorder    Tyler well on Ritalin. No side effectsTakes short-acting ritalin three times a day.refilled I looked up Tyler Hobbs on [place] Controlled Substances Reporting System and saw no activity that raised concern of inappropriate use.        Relevant Medications   methylphenidate (RITALIN) 10 MG tablet   methylphenidate (RITALIN) 10 MG tablet   methylphenidate (RITALIN) 10 MG tablet   Annual physical exam - Primary    Declines testicular exam in the absence of symptoms.screening labs to be done at work. Encouraged continued exercise.           I am having Tyler Hobbs maintain his fluticasone, loratadine-pseudoephedrine, MULTIVITAMIN GUMMIES ADULTS, methylphenidate, methylphenidate, and methylphenidate.   Meds ordered this encounter  Medications  . methylphenidate (RITALIN) 10 MG tablet    Sig: 1.5 tablets in the am, 1.5 tablets at noon, and 1 tablet in the evening.    Dispense:  120 tablet    Refill:  0    Order Specific Question:   Supervising Provider    Answer:   Duncan Dull L [[id number]]  . methylphenidate (RITALIN) 10 MG tablet    Sig: 1.5 tablets in the morning, 1.5 tablets at noon and 1 tablet in the evening.    Dispense:  120 tablet    Refill:  0    Do not fill before 11/17/17    Order Specific Question:   Supervising Provider    Answer:   Duncan Dull L [[id number]]  . methylphenidate (RITALIN) 10 MG  tablet    Sig: 1.5 tablets in the morning, 1.5 tablets at noon and 1 tablet in the evening.    Dispense:  120 tablet    Refill:  0    Do not fill before 12/15/2017    Order Specific Question:   Supervising Provider    Answer:   Sherlene Shams [[id number]]    Return precautions given.   Risks, benefits, and alternatives of the medications and treatment plan prescribed Hobbs were discussed, and Tyler Hobbs.   Education regarding symptom management  and diagnosis given to Tyler Hobbs on AVS.   Continue to follow with Allegra GranaArnett, Margaret G, FNP for routine health maintenance.   Samule OhmPatrick Gasiorowski and I agreed with plan.   Rennie PlowmanMargaret Arnett, FNP

## 2017-10-17 NOTE — Assessment & Plan Note (Signed)
Doing well on Ritalin. No side effectsTakes short-acting ritalin three times a day.refilled I looked up patient on [place] Controlled Substances Reporting System and saw no activity that raised concern of inappropriate use.

## 2017-10-23 ENCOUNTER — Encounter: Payer: Self-pay | Admitting: Family

## 2017-10-27 ENCOUNTER — Telehealth: Payer: Self-pay | Admitting: Family

## 2017-10-27 ENCOUNTER — Other Ambulatory Visit: Payer: Self-pay

## 2017-10-27 DIAGNOSIS — Z0000 Encounter for general adult medical examination without abnormal findings: Secondary | ICD-10-CM

## 2017-10-27 NOTE — Telephone Encounter (Signed)
Copied from CRM [phone]. Topic: Quick Communication - See Telephone Encounter >> Oct 27, 2017  8:11 AM Guinevere FerrariMorris, Christalynn Boise E, NT wrote: CRM for notification. See Telephone encounter for:  10/27/17. [facility] and there are no orders in the system.

## 2017-10-28 LAB — CBC WITH DIFFERENTIAL/PLATELET
BASOS: 0 %
Basophils Absolute: 0 10*3/uL (ref 0.0–0.2)
EOS (ABSOLUTE): 0.1 10*3/uL (ref 0.0–0.4)
EOS: 1 %
HEMATOCRIT: 46.5 % (ref 37.5–51.0)
Hemoglobin: 15.8 g/dL (ref 13.0–17.7)
IMMATURE GRANULOCYTES: 1 %
Immature Grans (Abs): 0 10*3/uL (ref 0.0–0.1)
Lymphocytes Absolute: 2.1 10*3/uL (ref 0.7–3.1)
Lymphs: 40 %
MCH: 31.2 pg (ref 26.6–33.0)
MCHC: 34 g/dL (ref 31.5–35.7)
MCV: 92 fL (ref 79–97)
MONOS ABS: 0.5 10*3/uL (ref 0.1–0.9)
Monocytes: 9 %
NEUTROS PCT: 49 %
Neutrophils Absolute: 2.5 10*3/uL (ref 1.4–7.0)
PLATELETS: 200 10*3/uL (ref 150–379)
RBC: 5.06 x10E6/uL (ref 4.14–5.80)
RDW: 13.4 % (ref 12.3–15.4)
WBC: 5.2 10*3/uL (ref 3.4–10.8)

## 2017-10-28 LAB — LIPID PANEL
CHOLESTEROL TOTAL: 166 mg/dL (ref 100–199)
Chol/HDL Ratio: 4 ratio (ref 0.0–5.0)
HDL: 42 mg/dL (ref >39)
LDL Calculated: 108 mg/dL — ABNORMAL HIGH (ref 0–99)
Triglycerides: 79 mg/dL (ref 0–149)
VLDL CHOLESTEROL CAL: 16 mg/dL (ref 5–40)

## 2017-10-28 LAB — TSH: TSH: 2.29 u[IU]/mL (ref 0.450–4.500)

## 2017-10-28 LAB — COMPREHENSIVE METABOLIC PANEL
A/G RATIO: 2.4 — AB (ref 1.2–2.2)
ALT: 23 IU/L (ref 0–44)
AST: 20 IU/L (ref 0–40)
Albumin: 4.7 g/dL (ref 3.5–5.5)
Alkaline Phosphatase: 48 IU/L (ref 39–117)
BILIRUBIN TOTAL: 0.7 mg/dL (ref 0.0–1.2)
BUN/Creatinine Ratio: 18 (ref 9–20)
BUN: 17 mg/dL (ref 6–20)
CALCIUM: 9.7 mg/dL (ref 8.7–10.2)
CHLORIDE: 103 mmol/L (ref 96–106)
CO2: 27 mmol/L (ref 20–29)
Creatinine, Ser: 0.96 mg/dL (ref 0.76–1.27)
GFR calc Af Amer: 115 mL/min/{1.73_m2} (ref >59)
GFR calc non Af Amer: 100 mL/min/{1.73_m2} (ref >59)
GLUCOSE: 101 mg/dL — AB (ref 65–99)
Globulin, Total: 2 g/dL (ref 1.5–4.5)
POTASSIUM: 4.9 mmol/L (ref 3.5–5.2)
Sodium: 143 mmol/L (ref 134–144)
Total Protein: 6.7 g/dL (ref 6.0–8.5)

## 2017-10-28 LAB — VITAMIN D 25 HYDROXY (VIT D DEFICIENCY, FRACTURES): VIT D 25 HYDROXY: 37.7 ng/mL (ref 30.0–100.0)

## 2017-10-28 LAB — HGB A1C W/O EAG: HEMOGLOBIN A1C: 5.1 % (ref 4.8–5.6)

## 2018-01-01 ENCOUNTER — Other Ambulatory Visit: Payer: Self-pay | Admitting: Family

## 2018-01-01 DIAGNOSIS — F988 Other specified behavioral and emotional disorders with onset usually occurring in childhood and adolescence: Secondary | ICD-10-CM

## 2018-01-01 MED ORDER — METHYLPHENIDATE HCL 10 MG PO TABS: ORAL_TABLET | ORAL | 0 refills | Status: DC

## 2018-01-01 NOTE — Telephone Encounter (Signed)
Refill request for ritalin, last seen 10-17-17, last filled 10-17-2017.  Please advise.

## 2018-02-07 ENCOUNTER — Other Ambulatory Visit: Payer: Self-pay | Admitting: Internal Medicine

## 2018-02-07 DIAGNOSIS — F988 Other specified behavioral and emotional disorders with onset usually occurring in childhood and adolescence: Secondary | ICD-10-CM

## 2018-02-07 MED ORDER — METHYLPHENIDATE HCL 10 MG PO TABS: ORAL_TABLET | ORAL | 0 refills | Status: DC

## 2018-02-07 NOTE — Telephone Encounter (Signed)
Refilled: 10/17/2018 Last OV: 10/17/2017 Next OV: not scheduled

## 2018-02-07 NOTE — Telephone Encounter (Signed)
Left message advising script ready to pick ,script placed up front for pick up

## 2018-02-07 NOTE — Telephone Encounter (Signed)
I looked up patient on [place] Controlled Substances Reporting System and saw no activity that raised concern of inappropriate use.   Refilled.   Please fax and let pt know

## 2018-04-12 ENCOUNTER — Encounter: Payer: Self-pay | Admitting: Family

## 2018-04-12 DIAGNOSIS — F988 Other specified behavioral and emotional disorders with onset usually occurring in childhood and adolescence: Secondary | ICD-10-CM

## 2018-04-12 NOTE — Telephone Encounter (Signed)
Called patient he states he has script , he will contact pharmacy in [place] and see if they can file script. Scheduled appointment to tollow up with you in May.

## 2018-04-30 ENCOUNTER — Telehealth: Payer: Self-pay | Admitting: Family

## 2018-04-30 NOTE — Telephone Encounter (Signed)
Call pt He didn't read mychart message- basically he needs appt scheduled for controlled substance and refills of ritalin.     Ms Jonanthony, Nahar shouldn't have an issue filling Ritalin while away .Marland Kitchen However I would advise you to fill prior to your leaving as an ideal scenario.   For appointments, you would need to call to schedule if you dont mind as it is constantly changing and our schedulers handle this over the phone. And yes, we need to see you every 3-4 months if on a controlled substance.    Tyler Hobbs

## 2018-05-01 ENCOUNTER — Other Ambulatory Visit: Payer: Self-pay | Admitting: Family

## 2018-05-01 DIAGNOSIS — F988 Other specified behavioral and emotional disorders with onset usually occurring in childhood and adolescence: Secondary | ICD-10-CM

## 2018-05-01 NOTE — Telephone Encounter (Signed)
Patient has appointment on 05/16/18

## 2018-05-02 ENCOUNTER — Other Ambulatory Visit: Payer: Self-pay | Admitting: Family

## 2018-05-02 DIAGNOSIS — F988 Other specified behavioral and emotional disorders with onset usually occurring in childhood and adolescence: Secondary | ICD-10-CM

## 2018-05-02 MED ORDER — METHYLPHENIDATE HCL 10 MG PO TABS: ORAL_TABLET | ORAL | 0 refills | Status: DC

## 2018-05-02 NOTE — Progress Notes (Signed)
Patient advised of below, script placed up front for pick up

## 2018-05-02 NOTE — Progress Notes (Unsigned)
I looked up patient on [place] Controlled Substances Reporting System and saw no activity that raised concern of inappropriate use.   Call pt- let him know I have refilled. We will see  Him for his appt.

## 2018-05-16 ENCOUNTER — Ambulatory Visit: Payer: BLUE CROSS/BLUE SHIELD | Admitting: Family

## 2018-05-16 ENCOUNTER — Encounter: Payer: Self-pay | Admitting: Family

## 2018-05-16 DIAGNOSIS — R030 Elevated blood-pressure reading, without diagnosis of hypertension: Secondary | ICD-10-CM | POA: Diagnosis not present

## 2018-05-16 DIAGNOSIS — F988 Other specified behavioral and emotional disorders with onset usually occurring in childhood and adolescence: Secondary | ICD-10-CM | POA: Diagnosis not present

## 2018-05-16 NOTE — Assessment & Plan Note (Signed)
Doing well on regimen.  Will continue. Note, in the future I will prescribe 124 tablets for each month.

## 2018-05-16 NOTE — Patient Instructions (Addendum)
Remind me of the 124 tablets needed EACH month for ritalin.    Monitor blood pressure,  Goal is less than 130/80; if persistently higher, please make sooner follow up appointment so we can recheck you blood pressure and manage medications   Pleasure seeing you!

## 2018-05-16 NOTE — Progress Notes (Signed)
Subjective:    Patient ID: Tyler Hobbs, male    DOB: 1981-04-05, 37 y.o.   MRN: [id number]  CC: Tyler Hobbs is a 38 y.o. male who presents today for follow up.   HPI: ADHD/ADD- doing well on , , .No trouble sleeping, appetite, palpitations.  Prescription runs out on months with 31 days. Would like extra day of medication; 124 tablets is needed EACH month  No history of hypertension.  States he has been eating less healthy recently since his refrigerator broke;  Has been eating out of mini fridge.  No chest pain, headache.     HISTORY:  Past Medical History:  Diagnosis Date  . Asthma   . Chicken pox   . Hay fever   . Multiple allergies   . OSA (obstructive sleep apnea)    Past Surgical History:  Procedure Laterality Date  . ADENOIDECTOMY    . TYMPANOSTOMY TUBE PLACEMENT     Family History  Problem Relation Age of Onset  . Hypertension Father   . Cancer Maternal Aunt        lung  . Cancer Paternal Aunt        breast  . Alcohol abuse Paternal Uncle   . Arthritis Maternal Grandfather   . Colon cancer Neg Hx   . Prostate cancer Neg Hx     Allergies: Patient has no known allergies. Current Outpatient Medications on File Prior to Visit  Medication Sig Dispense Refill  . fluticasone (FLONASE) 50 MCG/ACT nasal spray Place 1 spray into both nostrils as needed for allergies or rhinitis.    Marland Kitchen loratadine-pseudoephedrine (CLARITIN-D 24 HOUR) 10-240 MG per 24 hr tablet Take 1 tablet by mouth as needed for allergies.    . methylphenidate (RITALIN) 10 MG tablet 1.5 tablets in the morning, 1.5 tablets at noon and 1 tablet in the evening. 120 tablet 0  . methylphenidate (RITALIN) 10 MG tablet 1.5 tablets in the am, 1.5 tablets at noon, and 1 tablet in the evening. 120 tablet 0  . methylphenidate (RITALIN) 10 MG tablet 1.5 tablets in the morning, 1.5 tablets at noon and 1 tablet in the evening. 120 tablet 0  . Multiple Vitamins-Minerals (MULTIVITAMIN GUMMIES ADULTS)  CHEW Chew by mouth 2 (two) times daily.     No current facility-administered medications on file prior to visit.     Social History   Tobacco Use  . Smoking status: Former Smoker    Last attempt to quit: 09/01/2001    Years since quitting: 16.7  . Smokeless tobacco: Never Used  Substance Use Topics  . Alcohol use: Yes    Alcohol/week: 1.8 - 2.4 oz    Types: 3 - 4 Standard drinks or equivalent per week    Comment: both beer and liquor  . Drug use: Yes    Comment: weed. extacy.    Review of Systems  Constitutional: Negative for chills and fever.  Respiratory: Negative for cough.   Cardiovascular: Negative for chest pain and palpitations.  Gastrointestinal: Negative for nausea and vomiting.  Neurological: Negative for headaches.  Psychiatric/Behavioral: Negative for sleep disturbance. The patient is not nervous/anxious.       Objective:    BP 110/90 (BP Location: Left Arm, Patient Position: Sitting, Cuff Size: Large)   Pulse 67   Temp 98.3 F (36.8 C) (Oral)   Resp 16   Wt 211 lb 8 oz (95.9 kg)   SpO2 99%   BMI 28.68 kg/m  BP Readings from Last 3 Encounters:  05/16/18 110/90  10/17/17 130/80  11/14/16 129/89   Wt Readings from Last 3 Encounters:  05/16/18 211 lb 8 oz (95.9 kg)  10/17/17 203 lb (92.1 kg)  11/14/16 199 lb 6 oz (90.4 kg)    Physical Exam  Constitutional: He appears well-developed and well-nourished.  Cardiovascular: Regular rhythm and normal heart sounds.  Pulmonary/Chest: Effort normal and breath sounds normal. No respiratory distress. He has no wheezes. He has no rhonchi. He has no rales.  Lymphadenopathy:       Head (left side): No submandibular and no preauricular adenopathy present.  Neurological: He is alert.  Skin: Skin is warm and dry.  Psychiatric: He has a normal mood and affect. His speech is normal and behavior is normal.  Vitals reviewed.      Assessment & Plan:   Problem List Items Addressed This Visit      Other   Adult  attention deficit disorder    Doing well on regimen.  Will continue. Note, in the future I will prescribe 124 tablets for each month.      Elevated blood pressure reading    Slightly elevated today.  Suspect poor diet, weight gain contributory.  Patient to monitor at home and will let me know if persistent.          I am having Tyler Hobbs maintain his fluticasone, loratadine-pseudoephedrine, MULTIVITAMIN GUMMIES ADULTS, methylphenidate, methylphenidate, and methylphenidate.   No orders of the defined types were placed in this encounter.   Return precautions given.   Risks, benefits, and alternatives of the medications and treatment plan prescribed today were discussed, and patient expressed understanding.   Education regarding symptom management and diagnosis given to patient on AVS.  Continue to follow with Allegra Grana, FNP for routine health maintenance.   [name] and I agreed with plan.   Rennie Plowman, FNP

## 2018-05-16 NOTE — Assessment & Plan Note (Signed)
Slightly elevated today.  Suspect poor diet, weight gain contributory.  Patient to monitor at home and will let me know if persistent.

## 2018-07-26 ENCOUNTER — Other Ambulatory Visit: Payer: Self-pay | Admitting: Family

## 2018-07-26 ENCOUNTER — Encounter: Payer: Self-pay | Admitting: Family

## 2018-07-26 DIAGNOSIS — F988 Other specified behavioral and emotional disorders with onset usually occurring in childhood and adolescence: Secondary | ICD-10-CM

## 2018-07-27 MED ORDER — METHYLPHENIDATE HCL 10 MG PO TABS: ORAL_TABLET | ORAL | 0 refills | Status: DC

## 2018-07-27 NOTE — Telephone Encounter (Signed)
Last office visit 05-16-18

## 2018-07-27 NOTE — Telephone Encounter (Signed)
I looked up patient on [place] Controlled Substances Reporting System and saw no activity that raised concern of inappropriate use.   

## 2018-08-06 ENCOUNTER — Encounter: Payer: Self-pay | Admitting: Family

## 2018-08-09 ENCOUNTER — Ambulatory Visit: Payer: Self-pay | Admitting: Adult Health

## 2018-08-09 ENCOUNTER — Encounter: Payer: Self-pay | Admitting: Adult Health

## 2018-08-09 VITALS — BP 140/90 | HR 94 | Temp 99.1°F | Resp 20 | Wt 212.0 lb

## 2018-08-09 DIAGNOSIS — J302 Other seasonal allergic rhinitis: Secondary | ICD-10-CM

## 2018-08-09 DIAGNOSIS — J0110 Acute frontal sinusitis, unspecified: Secondary | ICD-10-CM

## 2018-08-09 MED ORDER — PREDNISONE 10 MG (21) PO TBPK: ORAL_TABLET | ORAL | 0 refills | Status: DC

## 2018-08-09 MED ORDER — AMOXICILLIN-POT CLAVULANATE 875-125 MG PO TABS: 1.0000 | ORAL_TABLET | Freq: Two times a day (BID) | ORAL | 0 refills | Status: DC

## 2018-08-09 MED ORDER — FLUTICASONE PROPIONATE 50 MCG/ACT NA SUSP: 2.0000 | Freq: Every day | NASAL | 2 refills | Status: DC

## 2018-08-09 NOTE — Progress Notes (Signed)
Subjective:     Patient ID: Tyler Hobbs, male   DOB: 01/29/1981, 37 y.o.   MRN: [id number]   Blood pressure 140/90, pulse 94, temperature 99.1 F (37.3 C), resp. rate 20, weight 212 lb (96.2 kg), head circumference 72" (182.9 cm), SpO2 100 %. Sinusitis  This is a new (last week was in [place] - he reports he is highly allergic to ragweed and there was much there. ) problem. The current episode started 1 to 4 weeks ago (2 weeks ). The problem has been gradually improving since onset. The maximum temperature recorded prior to his arrival was 100.4 - 100.9 F. The fever has been present for less than 1 day. His pain is at a severity of 5/10 (pressure frontal and at eyes / worse with bending forward ). The pain is moderate. Associated symptoms include chills, congestion, headaches (intermittent sinus headaches- frontal- non currently ), sinus pressure, sneezing and a sore throat. Pertinent negatives include no coughing, diaphoresis, ear pain, hoarse voice, neck pain, shortness of breath or swollen glands. Past treatments include oral decongestants and saline sprays. The treatment provided moderate relief.   He reports he has been in [place] for three years without any allergy issues. He does report having a lot of issues before he moved here that seemed to resolve after move to [address].He reports he did have allergy shots years ago but got to the point they released him from care.  He takes  Plain Claritin 10 mg daily - and switches allergy medication with Allegra and Zyrtec every year per allergist he saw before. 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 Denies any food allergies or drug allergies. He has taken Claritin with pseudoephedrine the past three days with these symptoms with mild improvement.   Review of Systems  Constitutional: Positive for chills, fatigue and fever. Negative for diaphoresis and unexpected weight change.  HENT: Positive for congestion, postnasal drip, rhinorrhea, sinus  pressure, sinus pain, sneezing and sore throat. Negative for dental problem, drooling, ear discharge, ear pain, facial swelling, hearing loss, hoarse voice, mouth sores, nosebleeds, tinnitus, trouble swallowing and voice change.   Eyes: Negative.  Negative for discharge.  Respiratory: Negative.  Negative for apnea, cough, choking, chest tightness, shortness of breath, wheezing and stridor.   Cardiovascular: Negative.   Gastrointestinal: Negative.   Endocrine: Negative.   Genitourinary: Negative.   Musculoskeletal: Negative.  Negative for neck pain.  Skin: Negative.   Allergic/Immunologic: Positive for environmental allergies. Negative for food allergies and immunocompromised state.  Neurological: Positive for headaches (intermittent sinus headaches- frontal- non currently ). Negative for dizziness, tremors, seizures, syncope, facial asymmetry, speech difficulty, weakness, light-headedness and numbness.  Hematological: Negative.   Psychiatric/Behavioral: Negative.        Objective:   Physical Exam  Constitutional: He appears well-developed and well-nourished. He is active.  Non-toxic appearance. He does not have a sickly appearance. He does not appear ill. No distress.  Patient is alert and oriented and responsive to questions Engages in eye contact with provider. Speaks in full sentences without any pauses without any shortness of breath or distress.    HENT:  Head: Normocephalic and atraumatic.  Right Ear: Hearing, external ear and ear canal normal. Tympanic membrane is erythematous. A middle ear effusion is present.  Left Ear: Hearing, external ear and ear canal normal. Tympanic membrane is erythematous. Tympanic membrane is not perforated. A middle ear effusion (darker brown fluid bilaterally behind intact tympanic membranes/ dull membrane bilatearlly ) is present.  Lymphadenopathy:       Head (right side): No submental, no submandibular, no tonsillar, no preauricular, no posterior  auricular and no occipital adenopathy present.       Head (left side): No submental, no submandibular, no tonsillar, no preauricular, no posterior auricular and no occipital adenopathy present.    He has no cervical adenopathy.  Skin: He is not diaphoretic.       Assessment:       Acute non-recurrent frontal sinusitis  Seasonal allergic rhinitis, unspecified trigger   Plan:     Meds ordered this encounter  Medications  . amoxicillin-clavulanate (AUGMENTIN) 875-125 MG tablet    Sig: Take 1 tablet by mouth 2 (two) times daily.    Dispense:  20 tablet    Refill:  0  . fluticasone (FLONASE) 50 MCG/ACT nasal spray    Sig: Place 2 sprays into both nostrils daily.    Dispense:  16 g    Refill:  2  . predniSONE (STERAPRED UNI-PAK 21 TAB) 10 MG (21) TBPK tablet    Sig: PO: Take 6 tablets on day 1:Take 5 tablets day 2:Take 4 tablets day 3: Take 3 tablets day 4:Take 2 tablets day five: 5 Take 1 tablet day 6    Dispense:  21 tablet    Refill:  0   Use Flonase.  Discontinue Afrin- discussed rebound nasal congestion. Start medications as above.    Will refer to ENT if symptoms return - follow up with your Arnett, Lyn RecordsMargaret G, FNP - PCP for this if needed.  Advised patient call the office or your primary care doctor for an appointment if no improvement within 72 hours or if any symptoms change or worsen at any time  Advised ER or urgent Care if after hours or on weekend. Call 911 for emergency symptoms at any time.Patinet verbalized understanding of all instructions given/reviewed and treatment plan and has no further questions or concerns at this time.    Patient verbalized understanding of all instructions given and denies any further questions at this time.

## 2018-08-09 NOTE — Patient Instructions (Signed)
Eustachian Tube Dysfunction The eustachian tube connects the middle ear to the back of the nose. It regulates air pressure in the middle ear by allowing air to move between the ear and nose. It also helps to drain fluid from the middle ear space. When the eustachian tube does not function properly, air pressure, fluid, or both can build up in the middle ear. Eustachian tube dysfunction can affect one or both ears. What are the causes? This condition happens when the eustachian tube becomes blocked or cannot open normally. This may result from:  Ear infections.  Colds and other upper respiratory infections.  Allergies.  Irritation, such as from cigarette smoke or acid from the stomach coming up into the esophagus (gastroesophageal reflux).  Sudden changes in air pressure, such as from descending in an airplane.  Abnormal growths in the nose or throat, such as nasal polyps, tumors, or enlarged tissue at the back of the throat (adenoids).  What increases the risk? This condition may be more likely to develop in people who smoke and people who are overweight. Eustachian tube dysfunction may also be more likely to develop in children, especially children who have:  Certain birth defects of the mouth, such as cleft palate.  Large tonsils and adenoids.  What are the signs or symptoms? Symptoms of this condition may include:  A feeling of fullness in the ear.  Ear pain.  Clicking or popping noises in the ear.  Ringing in the ear.  Hearing loss.  Loss of balance.  Symptoms may get worse when the air pressure around you changes, such as when you travel to an area of high elevation or fly on an airplane. How is this diagnosed? This condition may be diagnosed based on:  Your symptoms.  A physical exam of your ear, nose, and throat.  Tests, such as those that measure: ? The movement of your eardrum (tympanogram). ? Your hearing (audiometry).  How is this treated? Treatment  depends on the cause and severity of your condition. If your symptoms are mild, you may be able to relieve your symptoms by moving air into ("popping") your ears. If you have symptoms of fluid in your ears, treatment may include:  Decongestants.  Antihistamines.  Nasal sprays or ear drops that contain medicines that reduce swelling (steroids).  In some cases, you may need to have a procedure to drain the fluid in your eardrum (myringotomy). In this procedure, a small tube is placed in the eardrum to:  Drain the fluid.  Restore the air in the middle ear space.  Follow these instructions at home:  Take over-the-counter and prescription medicines only as told by your health care provider.  Use techniques to help pop your ears as recommended by your health care provider. These may include: ? Chewing gum. ? Yawning. ? Frequent, forceful swallowing. ? Closing your mouth, holding your nose closed, and gently blowing as if you are trying to blow air out of your nose.  Do not do any of the following until your health care provider approves: ? Travel to high altitudes. ? Fly in airplanes. ? Work in a pressurized cabin or room. ? Scuba dive.  Keep your ears dry. Dry your ears completely after showering or bathing.  Do not smoke.  Keep all follow-up visits as told by your health care provider. This is important. Contact a health care provider if:  Your symptoms do not go away after treatment.  Your symptoms come back after treatment.  You are   unable to pop your ears.  You have: ? A fever. ? Pain in your ear. ? Pain in your head or neck. ? Fluid draining from your ear.  Your hearing suddenly changes.  You become very dizzy.  You lose your balance. This information is not intended to replace advice given to you by your health care provider. Make sure you discuss any questions you have with your health care provider. Document Released: 01/08/2016 Document Revised: 05/19/2016  Document Reviewed: 12/31/2014 Elsevier Interactive Patient Education  2018 [employer] Sinusitis, Adult Sinusitis is soreness and inflammation of your sinuses. Sinuses are hollow spaces in the bones around your face. They are located:  Around your eyes.  In the middle of your forehead.  Behind your nose.  In your cheekbones.  Your sinuses and nasal passages are lined with a stringy fluid (mucus). Mucus normally drains out of your sinuses. When your nasal tissues get inflamed or swollen, the mucus can get trapped or blocked so air cannot flow through your sinuses. This lets bacteria, viruses, and funguses grow, and that leads to infection. Follow these instructions at home: Medicines  Take, use, or apply over-the-counter and prescription medicines only as told by your doctor. These may include nasal sprays.  If you were prescribed an antibiotic medicine, take it as told by your doctor. Do not stop taking the antibiotic even if you start to feel better. Hydrate and Humidify  Drink enough water to keep your pee (urine) clear or pale yellow.  Use a cool mist humidifier to keep the humidity level in your home above 50%.  Breathe in steam for 10-15 minutes, 3-4 times a day or as told by your doctor. You can do this in the bathroom while a hot shower is running.  Try not to spend time in cool or dry air. Rest  Rest as much as possible.  Sleep with your head raised (elevated).  Make sure to get enough sleep each night. General instructions  Put a warm, moist washcloth on your face 3-4 times a day or as told by your doctor. This will help with discomfort.  Wash your hands often with soap and water. If there is no soap and water, use hand sanitizer.  Do not smoke. Avoid being around people who are smoking (secondhand smoke).  Keep all follow-up visits as told by your doctor. This is important. Contact a doctor if:  You have a fever.  Your symptoms get worse.  Your symptoms  do not get better within 10 days. Get help right away if:  You have a very bad headache.  You cannot stop throwing up (vomiting).  You have pain or swelling around your face or eyes.  You have trouble seeing.  You feel confused.  Your neck is stiff.  You have trouble breathing. This information is not intended to replace advice given to you by your health care provider. Make sure you discuss any questions you have with your health care provider. Document Released: 05/30/2008 Document Revised: 08/07/2016 Document Reviewed: 10/07/2015 Elsevier Interactive Patient Education  2018 [employer]  

## 2018-09-05 ENCOUNTER — Ambulatory Visit: Payer: BLUE CROSS/BLUE SHIELD | Admitting: Family

## 2018-09-13 ENCOUNTER — Ambulatory Visit: Payer: Self-pay

## 2018-09-17 ENCOUNTER — Encounter: Payer: Self-pay | Admitting: Family

## 2018-09-17 ENCOUNTER — Ambulatory Visit: Payer: BLUE CROSS/BLUE SHIELD | Admitting: Family

## 2018-09-17 VITALS — BP 130/90 | HR 74 | Temp 98.5°F | Resp 15 | Ht 72.0 in | Wt 214.5 lb

## 2018-09-17 DIAGNOSIS — F988 Other specified behavioral and emotional disorders with onset usually occurring in childhood and adolescence: Secondary | ICD-10-CM | POA: Diagnosis not present

## 2018-09-17 DIAGNOSIS — R030 Elevated blood-pressure reading, without diagnosis of hypertension: Secondary | ICD-10-CM | POA: Diagnosis not present

## 2018-09-17 MED ORDER — METHYLPHENIDATE HCL 10 MG PO TABS: ORAL_TABLET | ORAL | 0 refills | Status: DC

## 2018-09-17 NOTE — Assessment & Plan Note (Signed)
Stable. .I looked up patient on [place] Controlled Substances Reporting System and saw no activity that raised concern of inappropriate use.  Note: earlier fill dates provided so patient doesn't run out of medication.

## 2018-09-17 NOTE — Assessment & Plan Note (Signed)
DBP persistently elevated. Patient wlll try lifestyle changes ar home. If no difference at follow up, I would like to start amlodipine

## 2018-09-17 NOTE — Patient Instructions (Signed)
Monitor blood pressure,  Goal is less than 130/80; if persistently higher, we will discuss low dose medication at follow up   Managing Your Hypertension Hypertension is commonly called high blood pressure. This is when the force of your blood pressing against the walls of your arteries is too strong. Arteries are blood vessels that carry blood from your heart throughout your body. Hypertension forces the heart to work harder to pump blood, and may cause the arteries to become narrow or stiff. Having untreated or uncontrolled hypertension can cause heart attack, stroke, kidney disease, and other problems. What are blood pressure readings? A blood pressure reading consists of a higher number over a lower number. Ideally, your blood pressure should be below 120/80. The first ("top") number is called the systolic pressure. It is a measure of the pressure in your arteries as your heart beats. The second ("bottom") number is called the diastolic pressure. It is a measure of the pressure in your arteries as the heart relaxes. What does my blood pressure reading mean? Blood pressure is classified into four stages. Based on your blood pressure reading, your health care provider may use the following stages to determine what type of treatment you need, if any. Systolic pressure and diastolic pressure are measured in a unit called mm Hg. Normal  Systolic pressure: below 120.  Diastolic pressure: below 80. Elevated  Systolic pressure: 120-129.  Diastolic pressure: below 80. Hypertension stage 1  Systolic pressure: 130-139.  Diastolic pressure: 80-89. Hypertension stage 2  Systolic pressure: 140 or above.  Diastolic pressure: 90 or above. What health risks are associated with hypertension? Managing your hypertension is an important responsibility. Uncontrolled hypertension can lead to:  A heart attack.  A stroke.  A weakened blood vessel (aneurysm).  Heart failure.  Kidney damage.  Eye  damage.  Metabolic syndrome.  Memory and concentration problems.  What changes can I make to manage my hypertension? Hypertension can be managed by making lifestyle changes and possibly by taking medicines. Your health care provider will help you make a plan to bring your blood pressure within a normal range. Eating and drinking  Eat a diet that is high in fiber and potassium, and low in salt (sodium), added sugar, and fat. An example eating plan is called the DASH (Dietary Approaches to Stop Hypertension) diet. To eat this way: ? Eat plenty of fresh fruits and vegetables. Try to fill half of your plate at each meal with fruits and vegetables. ? Eat whole grains, such as whole wheat pasta, brown rice, or whole grain bread. Fill about one quarter of your plate with whole grains. ? Eat low-fat diary products. ? Avoid fatty cuts of meat, processed or cured meats, and poultry with skin. Fill about one quarter of your plate with lean proteins such as fish, chicken without skin, beans, eggs, and tofu. ? Avoid premade and processed foods. These tend to be higher in sodium, added sugar, and fat.  Reduce your daily sodium intake. Most people with hypertension should eat less than 1,500 mg of sodium a day.  Limit alcohol intake to no more than 1 drink a day for nonpregnant women and 2 drinks a day for men. One drink equals 12 oz of beer, 5 oz of wine, or 1 oz of hard liquor. Lifestyle  Work with your health care provider to maintain a healthy body weight, or to lose weight. Ask what an ideal weight is for you.  Get at least 30 minutes of exercise that  causes your heart to beat faster (aerobic exercise) most days of the week. Activities may include walking, swimming, or biking.  Include exercise to strengthen your muscles (resistance exercise), such as weight lifting, as part of your weekly exercise routine. Try to do these types of exercises for 30 minutes at least 3 days a week.  Do not use any  products that contain nicotine or tobacco, such as cigarettes and e-cigarettes. If you need help quitting, ask your health care provider.  Control any long-term (chronic) conditions you have, such as high cholesterol or diabetes. Monitoring  Monitor your blood pressure at home as told by your health care provider. Your personal target blood pressure may vary depending on your medical conditions, your age, and other factors.  Have your blood pressure checked regularly, as often as told by your health care provider. Working with your health care provider  Review all the medicines you take with your health care provider because there may be side effects or interactions.  Talk with your health care provider about your diet, exercise habits, and other lifestyle factors that may be contributing to hypertension.  Visit your health care provider regularly. Your health care provider can help you create and adjust your plan for managing hypertension. Will I need medicine to control my blood pressure? Your health care provider may prescribe medicine if lifestyle changes are not enough to get your blood pressure under control, and if:  Your systolic blood pressure is 130 or higher.  Your diastolic blood pressure is 80 or higher.  Take medicines only as told by your health care provider. Follow the directions carefully. Blood pressure medicines must be taken as prescribed. The medicine does not work as well when you skip doses. Skipping doses also puts you at risk for problems. Contact a health care provider if:  You think you are having a reaction to medicines you have taken.  You have repeated (recurrent) headaches.  You feel dizzy.  You have swelling in your ankles.  You have trouble with your vision. Get help right away if:  You develop a severe headache or confusion.  You have unusual weakness or numbness, or you feel faint.  You have severe pain in your chest or abdomen.  You vomit  repeatedly.  You have trouble breathing. Summary  Hypertension is when the force of blood pumping through your arteries is too strong. If this condition is not controlled, it may put you at risk for serious complications.  Your personal target blood pressure may vary depending on your medical conditions, your age, and other factors. For most people, a normal blood pressure is less than 120/80.  Hypertension is managed by lifestyle changes, medicines, or both. Lifestyle changes include weight loss, eating a healthy, low-sodium diet, exercising more, and limiting alcohol. This information is not intended to replace advice given to you by your health care provider. Make sure you discuss any questions you have with your health care provider. Document Released: 09/05/2012 Document Revised: 11/09/2016 Document Reviewed: 11/09/2016 Elsevier Interactive Patient Education  [employer].

## 2018-09-17 NOTE — Progress Notes (Signed)
Subjective:    Patient ID: Tyler Hobbs, male    DOB: 01/25/1981, 37 y.o.   MRN: [id number]  CC: Tyler Hobbs is a 37 y.o. male who presents today for follow up.   HPI: ADHD- doing well on current regimen. States depending on month ( if 30 or 31 days) may be a few tablets short. Last fill 09/13/18.   Weight stable. Eating out a lot. Not as much exercise. Father has HTN. Denies exertional chest pain or pressure, numbness or tingling radiating to left arm or jaw, palpitations, dizziness, frequent headaches, changes in vision, or shortness of breath.      HISTORY:  Past Medical History:  Diagnosis Date  . Asthma   . Chicken pox   . Hay fever   . Multiple allergies   . OSA (obstructive sleep apnea)    Past Surgical History:  Procedure Laterality Date  . ADENOIDECTOMY    . TYMPANOSTOMY TUBE PLACEMENT     Family History  Problem Relation Age of Onset  . Hypertension Father   . Cancer Maternal Aunt        lung  . Cancer Paternal Aunt        breast  . Alcohol abuse Paternal Uncle   . Arthritis Maternal Grandfather   . Colon cancer Neg Hx   . Prostate cancer Neg Hx     Allergies: Patient has no known allergies. Current Outpatient Medications on File Prior to Visit  Medication Sig Dispense Refill  . fluticasone (FLONASE) 50 MCG/ACT nasal spray Place 2 sprays into both nostrils daily. 16 g 2  . loratadine-pseudoephedrine (CLARITIN-D 24 HOUR) 10-240 MG per 24 hr tablet Take 1 tablet by mouth as needed for allergies.    . Multiple Vitamins-Minerals (MULTIVITAMIN GUMMIES ADULTS) CHEW Chew by mouth 2 (two) times daily.     No current facility-administered medications on file prior to visit.     Social History   Tobacco Use  . Smoking status: Former Smoker    Last attempt to quit: 09/01/2001    Years since quitting: 17.0  . Smokeless tobacco: Never Used  Substance Use Topics  . Alcohol use: Yes    Alcohol/week: 3.0 - 4.0 standard drinks    Types: 3 - 4 Standard  drinks or equivalent per week    Comment: both beer and liquor  . Drug use: Never    Comment: weed. extacy.    Review of Systems  Constitutional: Negative for chills and fever.  Respiratory: Negative for cough and shortness of breath.   Cardiovascular: Negative for chest pain and palpitations.  Gastrointestinal: Negative for nausea and vomiting.      Objective:    BP 130/90 (BP Location: Left Arm, Patient Position: Sitting, Cuff Size: Normal)   Pulse 74   Temp 98.5 F (36.9 C) (Oral)   Resp 15   Ht 6' (1.829 m)   Wt 214 lb 8 oz (97.3 kg)   SpO2 100%   BMI 29.09 kg/m  BP Readings from Last 3 Encounters:  09/17/18 130/90  08/09/18 140/90  05/16/18 110/90   Wt Readings from Last 3 Encounters:  09/17/18 214 lb 8 oz (97.3 kg)  08/09/18 212 lb (96.2 kg)  05/16/18 211 lb 8 oz (95.9 kg)    Physical Exam  Constitutional: He appears well-developed and well-nourished.  Cardiovascular: Regular rhythm and normal heart sounds.  Pulmonary/Chest: Effort normal and breath sounds normal. No respiratory distress. He has no wheezes. He has no rhonchi. He has no rales.  Lymphadenopathy:       Head (left side): No submandibular and no preauricular adenopathy present.  Neurological: He is alert.  Skin: Skin is warm and dry.  Psychiatric: He has a normal mood and affect. His speech is normal and behavior is normal.  Vitals reviewed.      Assessment & Plan:   Problem List Items Addressed This Visit      Other   Adult attention deficit disorder    Stable. .I looked up patient on [place] Controlled Substances Reporting System and saw no activity that raised concern of inappropriate use.  Note: earlier fill dates provided so patient doesn't run out of medication.       Relevant Medications   methylphenidate (RITALIN) 10 MG tablet   methylphenidate (RITALIN) 10 MG tablet   methylphenidate (RITALIN) 10 MG tablet   Elevated blood pressure reading - Primary    DBP persistently elevated.  Patient wlll try lifestyle changes ar home. If no difference at follow up, I would like to start amlodipine          I have discontinued Luisa Hart Jeschke's amoxicillin-clavulanate and predniSONE. I am also having him maintain his loratadine-pseudoephedrine, MULTIVITAMIN GUMMIES ADULTS, fluticasone, methylphenidate, methylphenidate, and methylphenidate.   Meds ordered this encounter  Medications  . methylphenidate (RITALIN) 10 MG tablet    Sig: 1.5 tablets in the am, 1.5 tablets at noon, and 1 tablet in the evening.    Dispense:  120 tablet    Refill:  0    Do not fill until 12/08/2018    Order Specific Question:   Supervising Provider    Answer:   Duncan Dull L [[id number]]  . methylphenidate (RITALIN) 10 MG tablet    Sig: 1.5 tablets in the morning, 1.5 tablets at noon and 1 tablet in the evening.    Dispense:  120 tablet    Refill:  0    Fill on 11/08/2018 or later.    Order Specific Question:   Supervising Provider    Answer:   Sherlene Shams [[id number]]  . methylphenidate (RITALIN) 10 MG tablet    Sig: 1.5 tablets in the morning, 1.5 tablets at noon and 1 tablet in the evening.    Dispense:  124 tablet    Refill:  0    Fill on or after 10/08/18    Order Specific Question:   Supervising Provider    Answer:   Sherlene Shams [[id number]]    Return precautions given.   Risks, benefits, and alternatives of the medications and treatment plan prescribed today were discussed, and patient expressed understanding.   Education regarding symptom management and diagnosis given to patient on AVS.  Continue to follow with Allegra Grana, FNP for routine health maintenance.   Tyler Hobbs and I agreed with plan.   Rennie Plowman, FNP

## 2018-11-28 ENCOUNTER — Other Ambulatory Visit: Payer: Self-pay | Admitting: Adult Health

## 2018-12-13 ENCOUNTER — Encounter: Payer: Self-pay | Admitting: Family

## 2019-01-03 ENCOUNTER — Telehealth: Payer: Self-pay

## 2019-01-03 NOTE — Telephone Encounter (Signed)
New PA was submitted today & am waiting on response. Quantity was changed to 120 tablets monthly. Last PA was submitted for 30 tablets per day & patient currently takes 4.

## 2019-01-03 NOTE — Telephone Encounter (Signed)
I tried to call patient twice in regards to PA. I was trying to find out if he had tried & failed any previous medications. Insurance prefers Adderall XR or Concerta. Patient has no VM set up to leave message.

## 2019-01-04 ENCOUNTER — Telehealth: Payer: Self-pay

## 2019-01-04 NOTE — Telephone Encounter (Signed)
I called LM that PA for Ritalin was approved 01/03/19-01/01/22.

## 2019-01-10 ENCOUNTER — Other Ambulatory Visit: Payer: Self-pay | Admitting: Family

## 2019-01-10 DIAGNOSIS — F988 Other specified behavioral and emotional disorders with onset usually occurring in childhood and adolescence: Secondary | ICD-10-CM

## 2019-01-14 NOTE — Telephone Encounter (Signed)
Call patient Unfortunately he needs to be seen every 3 to 4 months for controlled substance refill.  Please advise him to make an appointment.

## 2019-01-14 NOTE — Telephone Encounter (Signed)
Patient has been scheduled

## 2019-01-21 ENCOUNTER — Ambulatory Visit: Payer: BLUE CROSS/BLUE SHIELD | Admitting: Family

## 2019-01-21 ENCOUNTER — Encounter: Payer: Self-pay | Admitting: Family

## 2019-01-21 VITALS — BP 124/86 | HR 76 | Temp 98.5°F | Wt 223.0 lb

## 2019-01-21 DIAGNOSIS — K219 Gastro-esophageal reflux disease without esophagitis: Secondary | ICD-10-CM | POA: Diagnosis not present

## 2019-01-21 DIAGNOSIS — F988 Other specified behavioral and emotional disorders with onset usually occurring in childhood and adolescence: Secondary | ICD-10-CM | POA: Diagnosis not present

## 2019-01-21 MED ORDER — METHYLPHENIDATE HCL 10 MG PO TABS: ORAL_TABLET | ORAL | 0 refills | Status: DC

## 2019-01-21 NOTE — Patient Instructions (Signed)
Check blood pressure at home- we wil go from there.   Monitor blood pressure,  Goal is less than 120/80, based on newest guidelines; if persistently higher, please make sooner follow up appointment so we can recheck you blood pressure and manage medications   Long term use beyond 3 months of proton pump inhibitors , also called PPI's, is associated with malabsorption of vitamins, chronic kidney disease, fracture risk, and diarrheal illnesses. PPI's include Nexium, Prilosec, Protonix, Dexilant, and Prevacid.   I generally recommend trying to control acid reflux with lifestyle modifications including avoiding trigger foods, not eating 2 hours prior to bedtime. You may use histamine 2 blockers daily to twice daily ( this is Zantac, Pepcid) and then when symptoms flare, start back on PPI for short course. I would advise to trial Pepcid.   Of note, we will need to do an endoscopy ( upper GI) to evaluate your esophagus, stomach in the future if acid reflux persists are you develop red flag symptoms: trouble swallowing, hoarseness, chronic cough, unexplained weight loss.  If reflux persists or you are not noticing significant improvement over next week, would like to hear from you and we would place a referral to GI for possible EGD.  Gastroesophageal Reflux Disease, Adult Normally, food travels down the esophagus and stays in the stomach to be digested. However, when a person has gastroesophageal reflux disease (GERD), food and stomach acid move back up into the esophagus. When this happens, the esophagus becomes sore and inflamed. Over time, GERD can create small holes (ulcers) in the lining of the esophagus.  CAUSES This condition is caused by a problem with the muscle between the esophagus and the stomach (lower esophageal sphincter, or LES). Normally, the LES muscle closes after food passes through the esophagus to the stomach. When the LES is weakened or abnormal, it does not close properly, and that  allows food and stomach acid to go back up into the esophagus. The LES can be weakened by certain dietary substances, medicines, and medical conditions, including:  Tobacco use.  Pregnancy.  Having a hiatal hernia.  Heavy alcohol use.  Certain foods and beverages, such as coffee, chocolate, onions, and peppermint. RISK FACTORS This condition is more likely to develop in:  People who have an increased body weight.  People who have connective tissue disorders.  People who use NSAID medicines. SYMPTOMS Symptoms of this condition include:  Heartburn.  Difficult or painful swallowing.  The feeling of having a lump in the throat.  Abitter taste in the mouth.  Bad breath.  Having a large amount of saliva.  Having an upset or bloated stomach.  Belching.  Chest pain.  Shortness of breath or wheezing.  Ongoing (chronic) cough or a night-time cough.  Wearing away of tooth enamel.  Weight loss. Different conditions can cause chest pain. Make sure to see your health care provider if you experience chest pain. DIAGNOSIS Your health care provider will take a medical history and perform a physical exam. To determine if you have mild or severe GERD, your health care provider may also monitor how you respond to treatment. You may also have other tests, including:  An endoscopy toexamine your stomach and esophagus with a small camera.  A test thatmeasures the acidity level in your esophagus.  A test thatmeasures how much pressure is on your esophagus.  A barium swallow or modified barium swallow to show the shape, size, and functioning of your esophagus. TREATMENT The goal of treatment is to  help relieve your symptoms and to prevent complications. Treatment for this condition may vary depending on how severe your symptoms are. Your health care provider may recommend:  Changes to your diet.  Medicine.  Surgery. HOME CARE INSTRUCTIONS Diet  Follow a diet as  recommended by your health care provider. This may involve avoiding foods and drinks such as:  Coffee and tea (with or without caffeine).  Drinks that containalcohol.  Energy drinks and sports drinks.  Carbonated drinks or sodas.  Chocolate and cocoa.  Peppermint and mint flavorings.  Garlic and onions.  Horseradish.  Spicy and acidic foods, including peppers, chili powder, curry powder, vinegar, hot sauces, and barbecue sauce.  Citrus fruit juices and citrus fruits, such as oranges, lemons, and limes.  Tomato-based foods, such as red sauce, chili, salsa, and pizza with red sauce.  Fried and fatty foods, such as donuts, french fries, potato chips, and high-fat dressings.  High-fat meats, such as hot dogs and fatty cuts of red and white meats, such as rib eye steak, sausage, ham, and bacon.  High-fat dairy items, such as whole milk, butter, and cream cheese.  Eat small, frequent meals instead of large meals.  Avoid drinking large amounts of liquid with your meals.  Avoid eating meals during the 2-3 hours before bedtime.  Avoid lying down right after you eat.  Do not exercise right after you eat. General Instructions  Pay attention to any changes in your symptoms.  Take over-the-counter and prescription medicines only as told by your health care provider. Do not take aspirin, ibuprofen, or other NSAIDs unless your health care provider told you to do so.  Do not use any tobacco products, including cigarettes, chewing tobacco, and e-cigarettes. If you need help quitting, ask your health care provider.  Wear loose-fitting clothing. Do not wear anything tight around your waist that causes pressure on your abdomen.  Raise (elevate) the head of your bed 6 inches (15cm).  Try to reduce your stress, such as with yoga or meditation. If you need help reducing stress, ask your health care provider.  If you are overweight, reduce your weight to an amount that is healthy for  you. Ask your health care provider for guidance about a safe weight loss goal.  Keep all follow-up visits as told by your health care provider. This is important. SEEK MEDICAL CARE IF:  You have new symptoms.  You have unexplained weight loss.  You have difficulty swallowing, or it hurts to swallow.  You have wheezing or a persistent cough.  Your symptoms do not improve with treatment.  You have a hoarse voice. SEEK IMMEDIATE MEDICAL CARE IF:  You have pain in your arms, neck, jaw, teeth, or back.  You feel sweaty, dizzy, or light-headed.  You have chest pain or shortness of breath.  You vomit and your vomit looks like blood or coffee grounds.  You faint.  Your stool is bloody or black.  You cannot swallow, drink, or eat.   This information is not intended to replace advice given to you by your health care provider. Make sure you discuss any questions you have with your health care provider.   Document Released: 09/21/2005 Document Revised: 09/02/2015 Document Reviewed: 04/08/2015 Elsevier Interactive Patient Education 2016 [employer].  Food Choices for Gastroesophageal Reflux Disease, Adult When you have gastroesophageal reflux disease (GERD), the foods you eat and your eating habits are very important. Choosing the right foods can help ease the discomfort of GERD. WHAT GENERAL GUIDELINES  DO I NEED TO FOLLOW?  Choose fruits, vegetables, whole grains, low-fat dairy products, and low-fat meat, fish, and poultry.  Limit fats such as oils, salad dressings, butter, nuts, and avocado.  Keep a food diary to identify foods that cause symptoms.  Avoid foods that cause reflux. These may be different for different people.  Eat frequent small meals instead of three large meals each day.  Eat your meals slowly, in a relaxed setting.  Limit fried foods.  Cook foods using methods other than frying.  Avoid drinking alcohol.  Avoid drinking large amounts of liquids with  your meals.  Avoid bending over or lying down until 2-3 hours after eating. WHAT FOODS ARE NOT RECOMMENDED? The following are some foods and drinks that may worsen your symptoms: Vegetables Tomatoes. Tomato juice. Tomato and spaghetti sauce. Chili peppers. Onion and garlic. Horseradish. Fruits Oranges, grapefruit, and lemon (fruit and juice). Meats High-fat meats, fish, and poultry. This includes hot dogs, ribs, ham, sausage, salami, and bacon. Dairy Whole milk and chocolate milk. Sour cream. Cream. Butter. Ice cream. Cream cheese.  Beverages Coffee and tea, with or without caffeine. Carbonated beverages or energy drinks. Condiments Hot sauce. Barbecue sauce.  Sweets/Desserts Chocolate and cocoa. Donuts. Peppermint and spearmint. Fats and Oils High-fat foods, including [place] fries and potato chips. Other Vinegar. Strong spices, such as black pepper, white pepper, red pepper, cayenne, curry powder, cloves, ginger, and chili powder. The items listed above may not be a complete list of foods and beverages to avoid. Contact your dietitian for more information.   This information is not intended to replace advice given to you by your health care provider. Make sure you discuss any questions you have with your health care provider.   Document Released: 12/12/2005 Document Revised: 01/02/2015 Document Reviewed: 10/16/2013 Elsevier Interactive Patient Education [employer] Elsevier Inc.

## 2019-01-21 NOTE — Progress Notes (Signed)
Subjective:    Patient ID: Tyler Hobbs, male    DOB: [birth year], 39 y.o.   MRN: [id number]  CC: Tyler Hobbs is a 38 y.o. male who presents today for follow up.   HPI: ADHD- doing well on medication. Works well. No trouble sleeping, increased anxiety.   Working out prior to work, push up and sit ups. No CP, SOB.   Father has HTN.   Complains of epigastic burning intermittently past couple of months, more noticeable. Self limiting.  'puts hot sauce on everything. ' Worse after chilli. Will eat Tum with relief. No abdominal pain, trouble swallowing, fever, unintentional weight loss.  Non smoker.   Would like physical labs drawn today.  He can do this at his work.  denies any trouble urinating, urinary hesitancy, testicular masses or swelling. HISTORY:  Past Medical History:  Diagnosis Date  . Asthma   . Chicken pox   . Hay fever   . Multiple allergies   . OSA (obstructive sleep apnea)    Past Surgical History:  Procedure Laterality Date  . ADENOIDECTOMY    . TYMPANOSTOMY TUBE PLACEMENT     Family History  Problem Relation Age of Onset  . Hypertension Father   . Cancer Maternal Aunt        lung  . Cancer Paternal Aunt        breast  . Alcohol abuse Paternal Uncle   . Arthritis Maternal Grandfather   . Colon cancer Neg Hx   . Prostate cancer Neg Hx     Allergies: Patient has no known allergies. Current Outpatient Medications on File Prior to Visit  Medication Sig Dispense Refill  . fluticasone (FLONASE) 50 MCG/ACT nasal spray Place 2 sprays into both nostrils daily. 16 g 2  . loratadine-pseudoephedrine (CLARITIN-D 24 HOUR) 10-240 MG per 24 hr tablet Take 1 tablet by mouth as needed for allergies.    . Multiple Vitamins-Minerals (MULTIVITAMIN GUMMIES ADULTS) CHEW Chew by mouth 2 (two) times daily.     No current facility-administered medications on file prior to visit.     Social History   Tobacco Use  . Smoking status: Former Smoker    Last attempt to  quit: 09/01/2001    Years since quitting: 17.4  . Smokeless tobacco: Never Used  Substance Use Topics  . Alcohol use: Yes    Alcohol/week: 3.0 - 4.0 standard drinks    Types: 3 - 4 Standard drinks or equivalent per week    Comment: both beer and liquor  . Drug use: Never    Comment: weed. extacy.    Review of Systems  Constitutional: Negative for chills and fever.  HENT: Negative for trouble swallowing.   Respiratory: Negative for cough.   Cardiovascular: Negative for chest pain and palpitations.  Gastrointestinal: Negative for abdominal distention, abdominal pain, blood in stool, constipation, diarrhea, nausea and vomiting.  Genitourinary: Negative for difficulty urinating, dysuria, scrotal swelling and testicular pain.      Objective:    BP 124/86 (BP Location: Left Arm, Patient Position: Sitting, Cuff Size: Large)   Pulse 76   Temp 98.5 F (36.9 C)   Wt 223 lb (101.2 kg)   SpO2 98%   BMI 30.24 kg/m  BP Readings from Last 3 Encounters:  01/21/19 124/86  09/17/18 130/90  08/09/18 140/90   Wt Readings from Last 3 Encounters:  01/21/19 223 lb (101.2 kg)  09/17/18 214 lb 8 oz (97.3 kg)  08/09/18 212 lb (96.2 kg)    Physical  Exam Vitals signs reviewed.  Constitutional:      Appearance: He is well-developed.  Cardiovascular:     Rate and Rhythm: Regular rhythm.     Heart sounds: Normal heart sounds.  Pulmonary:     Effort: Pulmonary effort is normal. No respiratory distress.     Breath sounds: Normal breath sounds. No wheezing, rhonchi or rales.  Skin:    General: Skin is warm and dry.  Neurological:     Mental Status: He is alert.  Psychiatric:        Speech: Speech normal.        Behavior: Behavior normal.        Assessment & Plan:   Problem List Items Addressed This Visit      Digestive   Gastroesophageal reflux disease    Suspect consistent with acid reflux.  Education provided on food avoidance, red flags, OTC medications to use.  Patient will try  Pepcid AC at home, he will let me know if symptoms do not improve, certainly worsen.        Other   Adult attention deficit disorder - Primary    Doing well on regimen. Will continue.  I looked up patient on [place] Controlled Substances Reporting System and saw no activity that raised concern of inappropriate use.        Relevant Medications   methylphenidate (RITALIN) 10 MG tablet   methylphenidate (RITALIN) 10 MG tablet   methylphenidate (RITALIN) 10 MG tablet   Other Relevant Orders   CBC with Differential/Platelet   Comprehensive metabolic panel   Hemoglobin A1c   Lipid panel   TSH   VITAMIN D 25 Hydroxy (Vit-D Deficiency, Fractures)       I am having Tyler Hobbs maintain his loratadine-pseudoephedrine, MULTIVITAMIN GUMMIES ADULTS, fluticasone, methylphenidate, methylphenidate, and methylphenidate.   Meds ordered this encounter  Medications  . methylphenidate (RITALIN) 10 MG tablet    Sig: 1.5 tablets in the am, 1.5 tablets at noon, and 1 tablet in the evening.    Dispense:  120 tablet    Refill:  0    Do not fill until 03/22/2019 or later    Order Specific Question:   Supervising Provider    Answer:   Duncan Dull L [[id number]]  . methylphenidate (RITALIN) 10 MG tablet    Sig: 1.5 tablets in the morning, 1.5 tablets at noon and 1 tablet in the evening.    Dispense:  120 tablet    Refill:  0    Fill on 02/21/2019 or later    Order Specific Question:   Supervising Provider    Answer:   Duncan Dull L [[id number]]  . methylphenidate (RITALIN) 10 MG tablet    Sig: 1.5 tablets in the morning, 1.5 tablets at noon and 1 tablet in the evening.    Dispense:  124 tablet    Refill:  0    Order Specific Question:   Supervising Provider    Answer:   Sherlene Shams [[id number]]    Return precautions given.   Risks, benefits, and alternatives of the medications and treatment plan prescribed today were discussed, and patient expressed understanding.   Education regarding symptom  management and diagnosis given to patient on AVS.  Continue to follow with Allegra Grana, FNP for routine health maintenance.   Tyler Hobbs and I agreed with plan.   Tyler Plowman, FNP

## 2019-01-21 NOTE — Assessment & Plan Note (Signed)
Doing well on regimen. Will continue.  I looked up patient on [place] Controlled Substances Reporting System and saw no activity that raised concern of inappropriate use.   

## 2019-01-21 NOTE — Assessment & Plan Note (Signed)
Suspect consistent with acid reflux.  Education provided on food avoidance, red flags, OTC medications to use.  Patient will try Pepcid AC at home, he will let me know if symptoms do not improve, certainly worsen.

## 2019-02-20 ENCOUNTER — Other Ambulatory Visit: Payer: Self-pay

## 2019-02-20 ENCOUNTER — Other Ambulatory Visit: Payer: Self-pay | Admitting: Family

## 2019-02-20 DIAGNOSIS — F988 Other specified behavioral and emotional disorders with onset usually occurring in childhood and adolescence: Secondary | ICD-10-CM

## 2019-02-22 ENCOUNTER — Telehealth: Payer: Self-pay | Admitting: *Deleted

## 2019-02-22 LAB — COMPREHENSIVE METABOLIC PANEL
ALT: 43 IU/L (ref 0–44)
AST: 27 IU/L (ref 0–40)
Albumin/Globulin Ratio: 2.4 — ABNORMAL HIGH (ref 1.2–2.2)
Albumin: 4.6 g/dL (ref 4.0–5.0)
Alkaline Phosphatase: 48 IU/L (ref 39–117)
BUN/Creatinine Ratio: 16 (ref 9–20)
BUN: 16 mg/dL (ref 6–20)
Bilirubin Total: 0.7 mg/dL (ref 0.0–1.2)
CO2: 21 mmol/L (ref 20–29)
Calcium: 9.6 mg/dL (ref 8.7–10.2)
Chloride: 102 mmol/L (ref 96–106)
Creatinine, Ser: 1.01 mg/dL (ref 0.76–1.27)
GFR calc Af Amer: 108 mL/min/{1.73_m2} (ref >59)
GFR calc non Af Amer: 93 mL/min/{1.73_m2} (ref >59)
GLOBULIN, TOTAL: 1.9 g/dL (ref 1.5–4.5)
Glucose: 106 mg/dL — ABNORMAL HIGH (ref 65–99)
Potassium: 4.6 mmol/L (ref 3.5–5.2)
SODIUM: 139 mmol/L (ref 134–144)
Total Protein: 6.5 g/dL (ref 6.0–8.5)

## 2019-02-22 LAB — CBC WITH DIFFERENTIAL/PLATELET
Basophils Absolute: 0 10*3/uL (ref 0.0–0.2)
Basos: 1 %
EOS (ABSOLUTE): 0.1 10*3/uL (ref 0.0–0.4)
Eos: 2 %
HEMATOCRIT: 48.2 % (ref 37.5–51.0)
Hemoglobin: 16.4 g/dL (ref 13.0–17.7)
Immature Grans (Abs): 0 10*3/uL (ref 0.0–0.1)
Immature Granulocytes: 0 %
LYMPHS: 37 %
Lymphocytes Absolute: 2.1 10*3/uL (ref 0.7–3.1)
MCH: 31.7 pg (ref 26.6–33.0)
MCHC: 34 g/dL (ref 31.5–35.7)
MCV: 93 fL (ref 79–97)
Monocytes Absolute: 0.6 10*3/uL (ref 0.1–0.9)
Monocytes: 10 %
Neutrophils Absolute: 2.8 10*3/uL (ref 1.4–7.0)
Neutrophils: 50 %
Platelets: 219 10*3/uL (ref 150–450)
RBC: 5.17 x10E6/uL (ref 4.14–5.80)
RDW: 12.1 % (ref 11.6–15.4)
WBC: 5.5 10*3/uL (ref 3.4–10.8)

## 2019-02-22 LAB — LIPID PANEL W/O CHOL/HDL RATIO
Cholesterol, Total: 165 mg/dL (ref 100–199)
HDL: 29 mg/dL — ABNORMAL LOW (ref >39)
LDL Calculated: 110 mg/dL — ABNORMAL HIGH (ref 0–99)
Triglycerides: 131 mg/dL (ref 0–149)
VLDL Cholesterol Cal: 26 mg/dL (ref 5–40)

## 2019-02-22 LAB — SPECIMEN STATUS REPORT

## 2019-02-22 LAB — VITAMIN D 25 HYDROXY (VIT D DEFICIENCY, FRACTURES): VIT D 25 HYDROXY: 32.5 ng/mL (ref 30.0–100.0)

## 2019-02-22 LAB — TSH: TSH: 2.51 u[IU]/mL (ref 0.450–4.500)

## 2019-02-22 LAB — HGB A1C W/O EAG: HEMOGLOBIN A1C: 5.1 % (ref 4.8–5.6)

## 2019-02-25 NOTE — Telephone Encounter (Signed)
Opened in error

## 2019-04-09 ENCOUNTER — Encounter: Payer: Self-pay | Admitting: Family

## 2019-04-10 ENCOUNTER — Other Ambulatory Visit: Payer: Self-pay

## 2019-04-10 ENCOUNTER — Encounter: Payer: Self-pay | Admitting: Family Medicine

## 2019-04-10 ENCOUNTER — Ambulatory Visit (INDEPENDENT_AMBULATORY_CARE_PROVIDER_SITE_OTHER): Payer: BLUE CROSS/BLUE SHIELD | Admitting: Family Medicine

## 2019-04-10 DIAGNOSIS — H6692 Otitis media, unspecified, left ear: Secondary | ICD-10-CM

## 2019-04-10 DIAGNOSIS — J302 Other seasonal allergic rhinitis: Secondary | ICD-10-CM | POA: Diagnosis not present

## 2019-04-10 MED ORDER — AMOXICILLIN-POT CLAVULANATE 875-125 MG PO TABS: 1.0000 | ORAL_TABLET | Freq: Two times a day (BID) | ORAL | 0 refills | Status: DC

## 2019-04-10 NOTE — Telephone Encounter (Signed)
Spoke with pt and he has been scheduled for a doxy.me visit with Leanora Cover today at 10:40. Pt is aware of appt date and time.

## 2019-04-10 NOTE — Progress Notes (Signed)
Patient ID: Tyler Hobbs, male   DOB: 1981-07-26, 38 y.o.   MRN: [id number]  Virtual Visit via video Note  This visit type was conducted due to national recommendations for restrictions regarding the COVID-19 pandemic (e.g. social distancing). This format is felt to be most appropriate for this patient at this time.  All issues noted in this document were discussed and addressed.  No physical exam was performed (except for noted visual exam findings with Video Visits).   I connected with [name] on 04/10/19 at 10:40 AM EDT by a video enabled telemedicine application and verified that I am speaking with the correct person using two identifiers. Location patient: home Location provider: [facility] Persons participating in the virtual visit: patient, provider  I discussed the limitations, risks, security and privacy concerns of performing an evaluation and management service by video and the availability of in person appointments. I also discussed with the patient that there may be a patient responsible charge related to this service. The patient expressed understanding and agreed to proceed.   HPI:   Patient and I connected via video chat due to left ear pain.  Patient states the pain is been building up over the past week.  He has a history of chronic seasonal allergies.  States when he lived back in [place], he was on weekly allergy injections due to high pollen allergy.  Patient states since being down in [place] for approximately 3 to 4 years, he has not resumed allergy injections and has been managing allergy symptoms with over-the-counter antihistamine and nasal spray.  Patient states sometimes over-the-counter antihistamine and nasal spray is not enough and he will develop either an ear infection or sinus infection.  Patient states left ear feels muffled and very sore.  Denies fever or chills.  Denies chest pain, shortness of breath or wheezing.  Denies GI or GU  issues.  Denies pain in the sinuses or headaches.   ROS: See pertinent positives and negatives per HPI.  Past Medical History:  Diagnosis Date  . Asthma   . Chicken pox   . Hay fever   . Multiple allergies   . OSA (obstructive sleep apnea)     Past Surgical History:  Procedure Laterality Date  . ADENOIDECTOMY    . TYMPANOSTOMY TUBE PLACEMENT      Family History  Problem Relation Age of Onset  . Hypertension Father   . Cancer Maternal Aunt        lung  . Cancer Paternal Aunt        breast  . Alcohol abuse Paternal Uncle   . Arthritis Maternal Grandfather   . Colon cancer Neg Hx   . Prostate cancer Neg Hx    Social History   Tobacco Use  . Smoking status: Former Smoker    Last attempt to quit: 09/01/2001    Years since quitting: 17.6  . Smokeless tobacco: Never Used  Substance Use Topics  . Alcohol use: Yes    Alcohol/week: 3.0 - 4.0 standard drinks    Types: 3 - 4 Standard drinks or equivalent per week    Comment: both beer and liquor     Current Outpatient Medications:  .  fluticasone (FLONASE) 50 MCG/ACT nasal spray, Place 2 sprays into both nostrils daily., Disp: 16 g, Rfl: 2 .  loratadine-pseudoephedrine (CLARITIN-D 24 HOUR) 10-240 MG per 24 hr tablet, Take 1 tablet by mouth as needed for allergies., Disp: , Rfl:  .  methylphenidate (RITALIN) 10 MG tablet,  1.5 tablets in the am, 1.5 tablets at noon, and 1 tablet in the evening., Disp: 120 tablet, Rfl: 0 .  methylphenidate (RITALIN) 10 MG tablet, 1.5 tablets in the morning, 1.5 tablets at noon and 1 tablet in the evening., Disp: 120 tablet, Rfl: 0 .  methylphenidate (RITALIN) 10 MG tablet, 1.5 tablets in the morning, 1.5 tablets at noon and 1 tablet in the evening., Disp: 124 tablet, Rfl: 0 .  Multiple Vitamins-Minerals (MULTIVITAMIN GUMMIES ADULTS) CHEW, Chew by mouth 2 (two) times daily., Disp: , Rfl:   EXAM:   GENERAL: alert, oriented, appears well and in no acute distress  HEENT: atraumatic, conjunttiva  clear, no obvious abnormalities on inspection of external nose and ears  NECK: normal movements of the head and neck  LUNGS: on inspection no signs of respiratory distress, breathing rate appears normal, no obvious gross SOB, gasping or wheezing  CV: no obvious cyanosis  MS: moves all visible extremities without noticeable abnormality  PSYCH/NEURO: pleasant and cooperative, no obvious depression or anxiety, speech and thought processing grossly intact  ASSESSMENT AND PLAN:  Discussed the following assessment and plan:  Left otitis media, unspecified otitis media type  Seasonal allergies  Patient will take Augmentin twice daily for 10 days due to suspected ear infection.  Patient also advised to continue using over-the-counter antihistamine daily and nasal spray to help keep seasonal allergy symptoms under control.  Advised patient if seasonal allergy symptoms persist and he continues develop either multiple ear infections or multiple sinus infections per season, he really should reconsider resuming allergy injections.  Patient will see how he does over this spring and summer season and reevaluate seeing an allergist at that time.   I discussed the assessment and treatment plan with the patient. The patient was provided an opportunity to ask questions and all were answered. The patient agreed with the plan and demonstrated an understanding of the instructions.   The patient was advised to call back or seek an in-person evaluation if the symptoms worsen or if the condition fails to improve as anticipated.   Tracey HarriesLauren M Firas Guardado, FNP

## 2019-04-24 ENCOUNTER — Encounter: Payer: Self-pay | Admitting: Family

## 2019-04-24 ENCOUNTER — Other Ambulatory Visit: Payer: Self-pay | Admitting: Family

## 2019-04-24 DIAGNOSIS — F988 Other specified behavioral and emotional disorders with onset usually occurring in childhood and adolescence: Secondary | ICD-10-CM

## 2019-04-24 MED ORDER — METHYLPHENIDATE HCL 10 MG PO TABS: ORAL_TABLET | ORAL | 0 refills | Status: DC

## 2019-04-24 NOTE — Telephone Encounter (Signed)
Last OV 04/10/2019  Last refilled # Rx's all sent for the same date 01/21/2019 two were disp 120 and 1 was disp 124 all three no refills   Next OV 04/29/2019  Sent to PCP for approval

## 2019-04-24 NOTE — Telephone Encounter (Signed)
I looked up patient on [place] Controlled Substances Reporting System and saw no activity that raised concern of inappropriate use.   

## 2019-04-29 ENCOUNTER — Ambulatory Visit (INDEPENDENT_AMBULATORY_CARE_PROVIDER_SITE_OTHER): Payer: BLUE CROSS/BLUE SHIELD | Admitting: Family

## 2019-04-29 ENCOUNTER — Encounter: Payer: Self-pay | Admitting: Family

## 2019-04-29 DIAGNOSIS — F988 Other specified behavioral and emotional disorders with onset usually occurring in childhood and adolescence: Secondary | ICD-10-CM

## 2019-04-29 DIAGNOSIS — K219 Gastro-esophageal reflux disease without esophagitis: Secondary | ICD-10-CM

## 2019-04-29 DIAGNOSIS — J302 Other seasonal allergic rhinitis: Secondary | ICD-10-CM

## 2019-04-29 MED ORDER — FLUTICASONE PROPIONATE 50 MCG/ACT NA SUSP: 2.0000 | Freq: Every day | NASAL | 6 refills | Status: AC

## 2019-04-29 NOTE — Assessment & Plan Note (Addendum)
Doing well on medication. Will continue.  I looked up patient on [place] Controlled Substances Reporting System and saw no activity that raised concern of inappropriate use.

## 2019-04-29 NOTE — Progress Notes (Signed)
This visit type was conducted due to national recommendations for restrictions regarding the COVID-19 pandemic (e.g. social distancing).  This format is felt to be most appropriate for this patient at this time.  All issues noted in this document were discussed and addressed.  No physical exam was performed (except for noted visual exam findings with Video Visits). Virtual Visit via Video Note  I connected with@  on 04/29/19 at  8:00 AM EDT by a video enabled telemedicine application and verified that I am speaking with the correct person using two identifiers.  Location patient: home Location provider:work  Persons participating in the virtual visit: patient, provider  I discussed the limitations of evaluation and management by telemedicine and the availability of in person appointments. The patient expressed understanding and agreed to proceed.   HPI:  Feels well. No complaints today.  ADHD- feels well on current dose. No increased anxiety, palpitations.   GERD- rare ; may have epigastric burning with 'spicy foods' and will take pepcid with resolve. No trouble swallowing or pain with swallowing, regurgitation, vomiting.   ROS: See pertinent positives and negatives per HPI.  Past Medical History:  Diagnosis Date  . Asthma   . Chicken pox   . Hay fever   . Multiple allergies   . OSA (obstructive sleep apnea)     Past Surgical History:  Procedure Laterality Date  . ADENOIDECTOMY    . TYMPANOSTOMY TUBE PLACEMENT      Family History  Problem Relation Age of Onset  . Hypertension Father   . Cancer Maternal Aunt        lung  . Cancer Paternal Aunt        breast  . Alcohol abuse Paternal Uncle   . Arthritis Maternal Grandfather   . Colon cancer Neg Hx   . Prostate cancer Neg Hx     SOCIAL HX: former smoker   Current Outpatient Medications:  .  loratadine-pseudoephedrine (CLARITIN-D 24 HOUR) 10-240 MG per 24 hr tablet, Take 1 tablet by mouth as needed for allergies.,  Disp: , Rfl:  .  methylphenidate (RITALIN) 10 MG tablet, 1.5 tablets in the am, 1.5 tablets at noon, and 1 tablet in the evening., Disp: 120 tablet, Rfl: 0 .  methylphenidate (RITALIN) 10 MG tablet, 1.5 tablets in the morning, 1.5 tablets at noon and 1 tablet in the evening., Disp: 120 tablet, Rfl: 0 .  methylphenidate (RITALIN) 10 MG tablet, 1.5 tablets in the morning, 1.5 tablets at noon and 1 tablet in the evening., Disp: 124 tablet, Rfl: 0 .  Multiple Vitamins-Minerals (MULTIVITAMIN GUMMIES ADULTS) CHEW, Chew by mouth 2 (two) times daily., Disp: , Rfl:  .  fluticasone (FLONASE) 50 MCG/ACT nasal spray, Place 2 sprays into both nostrils daily., Disp: 16 g, Rfl: 6  EXAM:  VITALS per patient if applicable:  GENERAL: alert, oriented, appears well and in no acute distress  HEENT: atraumatic, conjunttiva clear, no obvious abnormalities on inspection of external nose and ears  NECK: normal movements of the head and neck  LUNGS: on inspection no signs of respiratory distress, breathing rate appears normal, no obvious gross SOB, gasping or wheezing  CV: no obvious cyanosis  MS: moves all visible extremities without noticeable abnormality  PSYCH/NEURO: pleasant and cooperative, no obvious depression or anxiety, speech and thought processing grossly intact  ASSESSMENT AND PLAN:  Discussed the following assessment and plan:  Seasonal allergies - Plan: fluticasone (FLONASE) 50 MCG/ACT nasal spray  Adult attention deficit disorder  Gastroesophageal reflux disease,  esophagitis presence not specified  Problem List Items Addressed This Visit      Digestive   Gastroesophageal reflux disease    Controlled. Continue prn pepcid. Education provided on lifestyle recommendations.         Other   Adult attention deficit disorder    Doing well on medication. Will continue.  I looked up patient on [place] Controlled Substances Reporting System and saw no activity that raised concern of inappropriate  use.        Seasonal allergies - Primary   Relevant Medications   fluticasone (FLONASE) 50 MCG/ACT nasal spray        I discussed the assessment and treatment plan with the patient. The patient was provided an opportunity to ask questions and all were answered. The patient agreed with the plan and demonstrated an understanding of the instructions.   The patient was advised to call back or seek an in-person evaluation if the symptoms worsen or if the condition fails to improve as anticipated.   Rennie PlowmanMargaret Klinton Candelas, FNP

## 2019-04-29 NOTE — Assessment & Plan Note (Signed)
Controlled. Continue prn pepcid. Education provided on lifestyle recommendations.

## 2019-07-02 ENCOUNTER — Other Ambulatory Visit: Payer: Self-pay | Admitting: Family

## 2019-07-02 DIAGNOSIS — F988 Other specified behavioral and emotional disorders with onset usually occurring in childhood and adolescence: Secondary | ICD-10-CM

## 2019-07-03 MED ORDER — METHYLPHENIDATE HCL 10 MG PO TABS: ORAL_TABLET | ORAL | 0 refills | Status: DC

## 2019-07-03 NOTE — Telephone Encounter (Signed)
I looked up patient on [place] Controlled Substances Reporting System and saw no activity that raised concern of inappropriate use.   

## 2019-09-04 ENCOUNTER — Ambulatory Visit: Payer: Self-pay

## 2019-09-04 ENCOUNTER — Other Ambulatory Visit: Payer: Self-pay

## 2019-09-04 DIAGNOSIS — Z23 Encounter for immunization: Secondary | ICD-10-CM

## 2019-10-02 ENCOUNTER — Other Ambulatory Visit: Payer: Self-pay | Admitting: Internal Medicine

## 2019-10-02 ENCOUNTER — Other Ambulatory Visit: Payer: Self-pay | Admitting: Family

## 2019-10-02 DIAGNOSIS — F988 Other specified behavioral and emotional disorders with onset usually occurring in childhood and adolescence: Secondary | ICD-10-CM

## 2019-10-02 MED ORDER — METHYLPHENIDATE HCL 10 MG PO TABS: ORAL_TABLET | ORAL | 0 refills | Status: DC

## 2019-10-02 NOTE — Telephone Encounter (Signed)
[facility]   [facility]

## 2019-10-07 NOTE — Telephone Encounter (Signed)
Call pharmacy and make sure this went through   Thanks [place]

## 2019-10-07 NOTE — Telephone Encounter (Signed)
Fax has already been sent

## 2019-10-21 ENCOUNTER — Other Ambulatory Visit: Payer: Self-pay | Admitting: Family

## 2019-10-21 ENCOUNTER — Telehealth: Payer: Self-pay

## 2019-10-21 DIAGNOSIS — F988 Other specified behavioral and emotional disorders with onset usually occurring in childhood and adolescence: Secondary | ICD-10-CM

## 2019-10-21 MED ORDER — METHYLPHENIDATE HCL 10 MG PO TABS: ORAL_TABLET | ORAL | 0 refills | Status: DC

## 2019-10-21 NOTE — Telephone Encounter (Signed)
Patient called in to have medication sent over today . Dr normally states re fill cannot be filled before the 28th of every month however patient has 1 more left and would like this medication refilled right away to be picked up.

## 2019-10-21 NOTE — Telephone Encounter (Signed)
I've sent a refill to his pharmacy. He needs to have a follow-up scheduled with Joycelyn Schmid when she returns.

## 2019-10-21 NOTE — Telephone Encounter (Signed)
I called & gave verbal to pharmacy that it was okay to refill tomorrow. Pharmacist stated that patient had been calling constantly & should have medication up until the 28th. They will fill tomorrow for patient & I called to make patient aware.

## 2019-10-21 NOTE — Telephone Encounter (Signed)
Based on the controlled substance database it was filled on 09/23/19. The prescription should last 30 days and that would mean he should be out tomorrow. I am happy to allow him to refill it tomorrow based on how long the prescription was supposed to last. Please see if the pharmacy will accept a verbal to release the prescription tomorrow. Thanks.

## 2019-10-21 NOTE — Telephone Encounter (Signed)
I have sent patient a mychart message regarding refill & needing f/u appointment scheduled for ritalin refills.

## 2019-10-21 NOTE — Telephone Encounter (Signed)
Copied from [place] [phone]. Topic: General - Other >> Oct 21, 2019  9:16 AM Leward Quan A wrote: Reason for CRM: [facility] (RITALIN) 10 MG tablet that was faxed over but they refuse to fill that Rx because they say the cannot accept that they need it to be escribed to them. Please send Rx today patient is all out of his medication. Ph#  [phone]

## 2019-10-21 NOTE — Telephone Encounter (Signed)
Patient called in to have medication sent over today . Dr normally states re fill cannot be filled before the 28th of every month however patient has 1 more left and would like this medication refilled right away to be picked up.   Medication: methylphenidate (RITALIN) 10 MG tablet   [facility] :  [id number] [place], [place]  [address] [place] [address]  Phone: [phone] Fax: [phone]     Please advise.

## 2019-10-21 NOTE — Addendum Note (Signed)
Addended by: Jefferson Fuel on: 10/21/2019 01:21 PM   Modules accepted: Orders

## 2019-10-21 NOTE — Addendum Note (Signed)
Addended by: Leone Haven on: 10/21/2019 10:18 AM   Modules accepted: Orders

## 2019-10-21 NOTE — Telephone Encounter (Signed)
Copied from [place] [phone]. Topic: Quick Communication - Rx Refill/Question >> Oct 21, 2019  9:12 AM Rainey Pines A wrote: Medication: methylphenidate (RITALIN) 10 MG tablet (Pharmacy called and stated that they need medication sent over electronically and cannot except a fax.)  Has the patient contacted their pharmacy? Yes (Agent: If no, request that the patient contact the pharmacy for the refill.) (Agent: If yes, when and what did the pharmacy advise?)Contact PCP  Preferred Pharmacy (with phone number or street name): [id number] [address], [place] [phone] (Phone) [phone] (Fax)    Agent: Please be advised that RX refills may take up to 3 business days. We ask that you follow-up with your pharmacy.

## 2019-10-21 NOTE — Telephone Encounter (Signed)
Requested medication (s) are due for refill today: yes  Requested medication (s) are on the active medication list: yes  Last refill:  10/02/2019  Future visit scheduled: no  Notes to clinic:  Pharmacy called and stated that they need medication sent over electronically and cannot except a fax.)   Requested Prescriptions  Pending Prescriptions Disp Refills   methylphenidate (RITALIN) 10 MG tablet 120 tablet 0    Sig: 1.5 tablets in the morning, 1.5 tablets at noon and 1 tablet in the evening.     Not Delegated - Psychiatry:  Stimulants/ADHD Failed - 10/21/2019  9:19 AM      Failed - This refill cannot be delegated      Failed - Urine Drug Screen completed in last 360 days.      Failed - Valid encounter within last 3 months    Recent Outpatient Visits          5 months ago Seasonal allergies   [facility] [place], Yvetta Coder, FNP   6 months ago Left otitis media, unspecified otitis media type   [facility] [place], Jacquelynn Cree, FNP   9 months ago Adult attention deficit disorder   [facility] [place], Yvetta Coder, FNP   1 year ago Elevated blood pressure reading   [place], FNP   1 year ago Adult attention deficit disorder   [facility] [place], Yvetta Coder, FNP

## 2019-11-12 ENCOUNTER — Encounter: Payer: Self-pay | Admitting: Family Medicine

## 2019-11-13 ENCOUNTER — Ambulatory Visit: Payer: BLUE CROSS/BLUE SHIELD | Admitting: Family Medicine

## 2019-11-18 ENCOUNTER — Other Ambulatory Visit: Payer: Self-pay

## 2019-11-18 ENCOUNTER — Encounter: Payer: Self-pay | Admitting: Internal Medicine

## 2019-11-18 ENCOUNTER — Ambulatory Visit (INDEPENDENT_AMBULATORY_CARE_PROVIDER_SITE_OTHER): Payer: BC Managed Care – PPO | Admitting: Internal Medicine

## 2019-11-18 VITALS — Ht 72.0 in | Wt 223.0 lb

## 2019-11-18 DIAGNOSIS — Z9989 Dependence on other enabling machines and devices: Secondary | ICD-10-CM

## 2019-11-18 DIAGNOSIS — Z7189 Other specified counseling: Secondary | ICD-10-CM

## 2019-11-18 DIAGNOSIS — G4733 Obstructive sleep apnea (adult) (pediatric): Secondary | ICD-10-CM | POA: Diagnosis not present

## 2019-11-18 DIAGNOSIS — F988 Other specified behavioral and emotional disorders with onset usually occurring in childhood and adolescence: Secondary | ICD-10-CM | POA: Diagnosis not present

## 2019-11-18 MED ORDER — METHYLPHENIDATE HCL 10 MG PO TABS: ORAL_TABLET | ORAL | 0 refills | Status: DC

## 2019-11-18 NOTE — Assessment & Plan Note (Signed)
Diagnosed by prior remote  sleep study. Patient is using CPAP every night a minimum of 6 hours per night and notes improved daytime wakefulness and decreased fatigue . However his machine is over 40 years old and the manufacturer is no longer making supplies for his model.  Sleep study ordered

## 2019-11-18 NOTE — Assessment & Plan Note (Signed)
Patient has been taking the medication as prescribed and functioining well both at work and at home.  Denies side effects including tachycardia, nervousness and insomnia. Refills on Methylphenidate 10 mg #120 for nov 26 Dec 26 and [facility] [address]

## 2019-11-18 NOTE — Assessment & Plan Note (Signed)

## 2019-11-18 NOTE — Progress Notes (Signed)
Virtual Visit via [email or web address]  This visit type was conducted due to national recommendations for restrictions regarding the COVID-19 pandemic (e.g. social distancing).  This format is felt to be most appropriate for this patient at this time.  All issues noted in this document were discussed and addressed.  No physical exam was performed (except for noted visual exam findings with Video Visits).   I connected with@ on 11/18/19 at  8:00 AM EST by a video enabled telemedicine application and verified that I am speaking with the correct person using two identifiers. Location patient: office Location provider: home office Persons participating in the virtual visit: patient, provider  I discussed the limitations, risks, security and privacy concerns of performing an evaluation and management service by telephone and the availability of in person appointments. I also discussed with the patient that there may be a patient responsible charge related to this service. The patient expressed understanding and agreed to proceed.   Reason for visit: follow up on ADD, OSA  HPI:   38 r old male with OSA diagnosed over ten years ago,  Using CPAP nightly, and ADD longstanding managed with methylphenidate , presents with need for refills on meds and need for new CPAP .  States that his CPAP machine is sol old that the only known supplier of compatible acemask has warned him that they will not be available for [place] longer.  Using machine  6 to 8 hours per night with good results.  On the rare out of town night when he does not use it has notices a significant difference  ADD:  Concentration is good on current dosing regimen,  No side effects.    Elevated blood pressure He has no prior history of hypertension. He has checked  his blood pressure several times over the last several month and submitted readings for evaluation. Readings have been < 130/80.  The patient has no signs or symptoms of COVID 19 infection  (fever, cough, sore throat  or shortness of breath beyond what is typical for patient).  Patient denies contact with other persons with the above mentioned symptoms or with anyone confirmed to have COVID 19 .  He works at [employer] and has been tested regularly per their protocol   ROS: See pertinent positives and negatives per HPI.  Past Medical History:  Diagnosis Date  . Asthma   . Chicken pox   . Hay fever   . Multiple allergies   . OSA (obstructive sleep apnea)     Past Surgical History:  Procedure Laterality Date  . ADENOIDECTOMY    . TYMPANOSTOMY TUBE PLACEMENT      Family History  Problem Relation Age of Onset  . Hypertension Father   . Cancer Maternal Aunt        lung  . Cancer Paternal Aunt        breast  . Alcohol abuse Paternal Uncle   . Arthritis Maternal Grandfather   . Colon cancer Neg Hx   . Prostate cancer Neg Hx     SOCIAL HX:  reports that he quit smoking about 18 years ago. He has never used smokeless tobacco. He reports current alcohol use of about 3.0 - 4.0 standard drinks of alcohol per week. He reports that he does not use drugs.   Current Outpatient Medications:  .  fluticasone (FLONASE) 50 MCG/ACT nasal spray, Place 2 sprays into both nostrils daily., Disp: 16 g, Rfl: 6 .  loratadine-pseudoephedrine (CLARITIN-D 24 HOUR) 10-240 MG per  24 hr tablet, Take 1 tablet by mouth as needed for allergies., Disp: , Rfl:  .  [START ON 01/20/2020] methylphenidate (RITALIN) 10 MG tablet, 1.5 tablets in the morning, 1.5 tablets at noon and 1 tablet in the evening., Disp: 120 tablet, Rfl: 0 .  [START ON 12/21/2019] methylphenidate (RITALIN) 10 MG tablet, 1.5 tablets in the morning, 1.5 tablets at noon and 1 tablet in the evening., Disp: 120 tablet, Rfl: 0 .  [START ON 11/21/2019] methylphenidate (RITALIN) 10 MG tablet, 1.5 tablets in the am, 1.5 tablets at noon, and 1 tablet in the evening., Disp: 120 tablet, Rfl: 0 .  Multiple Vitamins-Minerals (MULTIVITAMIN  GUMMIES ADULTS) CHEW, Chew by mouth 2 (two) times daily., Disp: , Rfl:   EXAM:  VITALS per patient if applicable:  GENERAL: alert, oriented, appears well and in no acute distress  HEENT: atraumatic, conjunttiva clear, no obvious abnormalities on inspection of external nose and ears  NECK: normal movements of the head and neck  LUNGS: on inspection no signs of respiratory distress, breathing rate appears normal, no obvious gross SOB, gasping or wheezing  CV: no obvious cyanosis  MS: moves all visible extremities without noticeable abnormality  PSYCH/NEURO: pleasant and cooperative, no obvious depression or anxiety, speech and thought processing grossly intact  ASSESSMENT AND PLAN:  Discussed the following assessment and plan:  OSA on CPAP - Plan: sleep study  Adult attention deficit disorder - Plan: methylphenidate (RITALIN) 10 MG tablet, methylphenidate (RITALIN) 10 MG tablet, methylphenidate (RITALIN) 10 MG tablet  OSA (obstructive sleep apnea)  Educated about COVID-19 virus infection  Adult attention deficit disorder Patient has been taking the medication as prescribed and functioining well both at work and at home.  Denies side effects including tachycardia, nervousness and insomnia. Refills on Methylphenidate 10 mg #120 for nov 26 Dec 26 and [facility] [address]   OSA (obstructive sleep apnea) Diagnosed by prior remote  sleep study. Patient is using CPAP every night a minimum of 6 hours per night and notes improved daytime wakefulness and decreased fatigue . However his machine is over 23 years old and the manufacturer is no longer making supplies for his model.  Sleep study ordered   Educated about COVID-19 virus infection Educated patient on the newly broadened list of signs and symptoms of COVID-19 infection and ways to avoid the viral infection including washing hands frequently with soap and water,  using hand sanitizer if unable to wash, avoiding  touching face,  staying at home and limiting visitors,  and avoiding contact with people coming in and out of home.  Discussed the potential ineffectiveness of hand sanitizer if left in environments > 110 degrees (ie , the car).  Reminded patient to call office with questions/concerns.  The importance of social distancing was discussed today    I discussed the assessment and treatment plan with the patient. The patient was provided an opportunity to ask questions and all were answered. The patient agreed with the plan and demonstrated an understanding of the instructions.   The patient was advised to call back or seek an in-person evaluation if the symptoms worsen or if the condition fails to improve as anticipated.  I provided  25 minutes of non-face-to-face time during this encounter reviewing patient's current problems and previous labs , providing counseling on the above mentioned problems , and coordination  of care .  Sherlene Shams, MD

## 2019-12-17 ENCOUNTER — Encounter: Payer: Self-pay | Admitting: Family

## 2020-01-30 ENCOUNTER — Ambulatory Visit: Payer: BC Managed Care – PPO | Attending: Neurology

## 2020-01-30 DIAGNOSIS — G4733 Obstructive sleep apnea (adult) (pediatric): Secondary | ICD-10-CM | POA: Diagnosis not present

## 2020-01-31 ENCOUNTER — Other Ambulatory Visit: Payer: Self-pay

## 2020-02-05 ENCOUNTER — Encounter: Payer: Self-pay | Admitting: Family

## 2020-02-05 ENCOUNTER — Other Ambulatory Visit: Payer: Self-pay

## 2020-02-05 DIAGNOSIS — F988 Other specified behavioral and emotional disorders with onset usually occurring in childhood and adolescence: Secondary | ICD-10-CM

## 2020-02-06 NOTE — Telephone Encounter (Signed)
I have placed sleep study in blue folder. Patient was not looking for anything specific. Just wanted a better understanding I think of what was found.

## 2020-02-07 ENCOUNTER — Other Ambulatory Visit: Payer: Self-pay | Admitting: Family

## 2020-02-07 MED ORDER — METHYLPHENIDATE HCL 10 MG PO TABS: ORAL_TABLET | ORAL | 0 refills | Status: DC

## 2020-02-07 NOTE — Telephone Encounter (Signed)
Call pt   I have refilled your adderall  However I wanted to remind you that this is controlled substance.   In order for me to prescribe medication,  patients must be seen every 3 months.   Please make follow-up appointment this month for any further refills.    I looked up patient on [place] Controlled Substances Reporting System and saw no activity that raised concern of inappropriate use.

## 2020-02-10 ENCOUNTER — Telehealth: Payer: Self-pay

## 2020-02-10 NOTE — Telephone Encounter (Signed)
I called patient & let him know that I had sent his prescription, OV notes & sleep study notes to [facility]. I gave him their number to make sure that he followed up with them. [facility] [phone].   I was also about to schedule patient f/u appointment with Claris Che in March.

## 2020-02-10 NOTE — Telephone Encounter (Signed)
I have faxed script, OV notes & sleep study to [facility]. Pt called & informed of this as well as given their number to follow-up with them.

## 2020-02-20 NOTE — Telephone Encounter (Signed)
I spoke with Boneta Lucks for [facility]. She said that since we did not know exact CPAP pressure settings that you could put on patient's script "auto-range default setting 4-20". I have placed paperwork in blue folder with script you had written. I will fax back once changed.

## 2020-02-20 NOTE — Telephone Encounter (Signed)
I called [facility] & am supposed to receive a call back from [place] on their intake team. They did receive patient's paperwork but needed demographics as well as pressure setting for CPAP. I let them know I did not have this information, so she is supposed to call me back to discuss.

## 2020-02-27 NOTE — Telephone Encounter (Signed)
It seems all the records I sent to [facility] was not sufficient. They need a new script stating below since we do not know exact pressure settings.   I spoke with Boneta Lucks for [facility]. She said that since we did not know exact CPAP pressure settings that you could put on patient's script "auto-range default setting 4-20". I have placed paperwork in blue folder with script you had written. I will fax back once changed.

## 2020-03-12 ENCOUNTER — Encounter: Payer: Self-pay | Admitting: Family

## 2020-03-17 ENCOUNTER — Encounter: Payer: Self-pay | Admitting: Family

## 2020-03-17 ENCOUNTER — Telehealth (INDEPENDENT_AMBULATORY_CARE_PROVIDER_SITE_OTHER): Payer: BC Managed Care – PPO | Admitting: Family

## 2020-03-17 VITALS — Ht 72.0 in | Wt 206.0 lb

## 2020-03-17 DIAGNOSIS — G4733 Obstructive sleep apnea (adult) (pediatric): Secondary | ICD-10-CM

## 2020-03-17 DIAGNOSIS — F988 Other specified behavioral and emotional disorders with onset usually occurring in childhood and adolescence: Secondary | ICD-10-CM

## 2020-03-17 NOTE — Progress Notes (Signed)
Virtual Visit via Video Note  I connected with@  on 03/17/20 at  8:00 AM EDT by a video enabled telemedicine application and verified that I am speaking with the correct person using two identifiers.  Location patient: home Location provider:work  Persons participating in the virtual visit: patient, provider  I discussed the limitations of evaluation and management by telemedicine and the availability of in person appointments. The patient expressed understanding and agreed to proceed.   HPI:  Feels well. No complaints.   OSA- wearing at least  6 hours per night. Improved wakefulness, less fatigue. has cipap machine for 10 years, trying to get new machine and cost is $1000. Planning to go fitted for new machine. 02/04/2020 sleep study.   ADHD-feels well on ritalin.  No increased anxiety or trouble sleeping  ROS: See pertinent positives and negatives per HPI.  Past Medical History:  Diagnosis Date  . Asthma   . Chicken pox   . Hay fever   . Multiple allergies   . OSA (obstructive sleep apnea)     Past Surgical History:  Procedure Laterality Date  . ADENOIDECTOMY    . TYMPANOSTOMY TUBE PLACEMENT      Family History  Problem Relation Age of Onset  . Hypertension Father   . Cancer Maternal Aunt        lung  . Cancer Paternal Aunt        breast  . Alcohol abuse Paternal Uncle   . Arthritis Maternal Grandfather   . Colon cancer Neg Hx   . Prostate cancer Neg Hx        Current Outpatient Medications:  .  fluticasone (FLONASE) 50 MCG/ACT nasal spray, Place 2 sprays into both nostrils daily., Disp: 16 g, Rfl: 6 .  loratadine-pseudoephedrine (CLARITIN-D 24 HOUR) 10-240 MG per 24 hr tablet, Take 1 tablet by mouth as needed for allergies., Disp: , Rfl:  .  methylphenidate (RITALIN) 10 MG tablet, 1.5 tablets in the morning, 1.5 tablets at noon and 1 tablet in the evening., Disp: 120 tablet, Rfl: 0 .  methylphenidate (RITALIN) 10 MG tablet, 1.5 tablets in the morning, 1.5  tablets at noon and 1 tablet in the evening., Disp: 120 tablet, Rfl: 0 .  methylphenidate (RITALIN) 10 MG tablet, 1.5 tablets in the am, 1.5 tablets at noon, and 1 tablet in the evening., Disp: 120 tablet, Rfl: 0 .  Multiple Vitamins-Minerals (MULTIVITAMIN GUMMIES ADULTS) CHEW, Chew by mouth 2 (two) times daily., Disp: , Rfl:   EXAM:  VITALS per patient if applicable:  GENERAL: alert, oriented, appears well and in no acute distress  HEENT: atraumatic, conjunttiva clear, no obvious abnormalities on inspection of external nose and ears  NECK: normal movements of the head and neck  LUNGS: on inspection no signs of respiratory distress, breathing rate appears normal, no obvious gross SOB, gasping or wheezing  CV: no obvious cyanosis  MS: moves all visible extremities without noticeable abnormality  PSYCH/NEURO: pleasant and cooperative, no obvious depression or anxiety, speech and thought processing grossly intact  ASSESSMENT AND PLAN:  Discussed the following assessment and plan:  Adult attention deficit disorder - Plan: TSH, CBC with Differential/Platelet, Comprehensive metabolic panel, Hemoglobin A1c, Lipid panel, VITAMIN D 25 Hydroxy (Vit-D Deficiency, Fractures), Pain Management Screening Profile (10S)  OSA (obstructive sleep apnea) Problem List Items Addressed This Visit      Respiratory   OSA (obstructive sleep apnea)    Compliant. Symptoms improved. 02/04/2020 sleep study.  Other   Adult attention deficit disorder - Primary    Stable.  Awaiting to get records from [place] [facility].  They were sent here however I cannot find in the chart today.  Will have patient complete CSC.   Urine drug screen ordered as well      Relevant Orders   TSH   CBC with Differential/Platelet   Comprehensive metabolic panel   Hemoglobin A1c   Lipid panel   VITAMIN D 25 Hydroxy (Vit-D Deficiency, Fractures)   Pain Management Screening Profile (10S)      -we  discussed possible serious and likely etiologies, options for evaluation and workup, limitations of telemedicine visit vs in person visit, treatment, treatment risks and precautions. Pt prefers to treat via telemedicine empirically rather then risking or undertaking an in person visit at this moment. Patient agrees to seek prompt in person care if worsening, new symptoms arise, or if is not improving with treatment.   I discussed the assessment and treatment plan with the patient. The patient was provided an opportunity to ask questions and all were answered. The patient agreed with the plan and demonstrated an understanding of the instructions.   The patient was advised to call back or seek an in-person evaluation if the symptoms worsen or if the condition fails to improve as anticipated.   Mable Paris, FNP

## 2020-03-17 NOTE — Assessment & Plan Note (Signed)
Stable.  Awaiting to get records from [place] [facility].  They were sent here however I cannot find in the chart today.  Will have patient complete CSC.   Urine drug screen ordered as well

## 2020-03-17 NOTE — Assessment & Plan Note (Addendum)
Compliant. Symptoms improved. 02/04/2020 sleep study.

## 2020-03-17 NOTE — Addendum Note (Signed)
Addended by: Warden Fillers on: 03/17/2020 09:27 AM   Modules accepted: Orders

## 2020-03-19 NOTE — Telephone Encounter (Signed)
No answer, no voicemail.

## 2020-03-19 NOTE — Telephone Encounter (Signed)
[facility] called and said they have been unable to reach patient about picking up cpap and supplies. They verified patient's number but wanted provider to know he has not picked up the cpap.

## 2020-03-20 NOTE — Telephone Encounter (Signed)
I tried to call patient but VM ws full.

## 2020-03-20 NOTE — Telephone Encounter (Signed)
Mychart message sent to patient.

## 2020-03-20 NOTE — Progress Notes (Signed)
I called [place] [facility] & they are faxing over paperwork.

## 2020-03-20 NOTE — Progress Notes (Signed)
I have printed this for patient. I know he has a hard tine getting to the office. Are these okay to be mailed & mailed back?

## 2020-03-24 ENCOUNTER — Other Ambulatory Visit: Payer: Self-pay

## 2020-03-24 ENCOUNTER — Ambulatory Visit: Payer: BC Managed Care – PPO | Admitting: Family

## 2020-03-24 ENCOUNTER — Other Ambulatory Visit: Payer: BC Managed Care – PPO

## 2020-03-24 ENCOUNTER — Other Ambulatory Visit: Payer: Self-pay | Admitting: Family

## 2020-03-24 DIAGNOSIS — F988 Other specified behavioral and emotional disorders with onset usually occurring in childhood and adolescence: Secondary | ICD-10-CM

## 2020-03-24 NOTE — Progress Notes (Signed)
I have printed & patient has signed. He has faxed back to our office.

## 2020-03-25 ENCOUNTER — Telehealth: Payer: Self-pay | Admitting: Family

## 2020-03-25 DIAGNOSIS — F988 Other specified behavioral and emotional disorders with onset usually occurring in childhood and adolescence: Secondary | ICD-10-CM

## 2020-03-25 NOTE — Telephone Encounter (Signed)
I spoke with Jean Rosenthal at [facility] & he stated that if you write the actual date on the script by law they cannot fill until or after then. Of course different months have different amount of days. He said that they cannot fill any earlier than 3 days & that by law they couldn't refill earlier than that anyway. He stated you could write on the script "do not fill for 30 days", that way he always gets the 30 day supply.

## 2020-03-25 NOTE — Telephone Encounter (Signed)
Would you call pt's pharmacy?   I sent the below to Catie :  Catie,  Pt states that on some months he doesn't run out of ritalin by 1 day; do have to write 'do not fill until such an such date'. I wrote a prescription with 4 extra pills one time to avoid this and he was charged a $30 dollar copay. The pharmacy cannot fill the medication until one month anyway right     See note from Catie  Most pharmacies will fill 2 days early for CII. Depends on the pharmacy, and sometimes on insurance limitations.   Maralyn Sago may need to call the specific pharmacy to see what the issue is and how to avoid

## 2020-03-25 NOTE — Telephone Encounter (Signed)
I tried to call patient & was unable to reach. VM was full. I have sent patient mychart message.

## 2020-03-25 NOTE — Telephone Encounter (Signed)
Call pt  Advise him that he will need to state for refills when he calls for me to write do not fill for 30 days on his scripts Since I dont have this problem with other patients, he will need to state this with each refill request so that I rememeber

## 2020-03-27 LAB — SPECIMEN STATUS REPORT

## 2020-04-06 ENCOUNTER — Telehealth: Payer: Self-pay | Admitting: Family

## 2020-04-06 NOTE — Telephone Encounter (Signed)
Call pt  If no response , please mail a letter that we have not received labs  We got an ambiguous speciman report which I have not gotten before. Did he have his labs drawn ? Where??

## 2020-04-07 NOTE — Telephone Encounter (Signed)
I have sent mychart message to patient.  °

## 2020-04-08 ENCOUNTER — Other Ambulatory Visit: Payer: Self-pay | Admitting: *Deleted

## 2020-04-08 DIAGNOSIS — F988 Other specified behavioral and emotional disorders with onset usually occurring in childhood and adolescence: Secondary | ICD-10-CM

## 2020-04-08 DIAGNOSIS — Z0000 Encounter for general adult medical examination without abnormal findings: Secondary | ICD-10-CM

## 2020-04-09 ENCOUNTER — Other Ambulatory Visit: Payer: Self-pay

## 2020-04-09 ENCOUNTER — Other Ambulatory Visit: Payer: BC Managed Care – PPO

## 2020-04-09 DIAGNOSIS — Z0000 Encounter for general adult medical examination without abnormal findings: Secondary | ICD-10-CM

## 2020-04-09 DIAGNOSIS — F988 Other specified behavioral and emotional disorders with onset usually occurring in childhood and adolescence: Secondary | ICD-10-CM

## 2020-04-10 ENCOUNTER — Other Ambulatory Visit: Payer: Self-pay

## 2020-04-10 ENCOUNTER — Ambulatory Visit: Payer: BC Managed Care – PPO | Admitting: Nurse Practitioner

## 2020-04-10 VITALS — BP 137/86 | HR 74 | Temp 97.3°F | Resp 18 | Ht 72.0 in | Wt 212.0 lb

## 2020-04-10 DIAGNOSIS — J302 Other seasonal allergic rhinitis: Secondary | ICD-10-CM

## 2020-04-10 DIAGNOSIS — H6592 Unspecified nonsuppurative otitis media, left ear: Secondary | ICD-10-CM

## 2020-04-10 DIAGNOSIS — H66004 Acute suppurative otitis media without spontaneous rupture of ear drum, recurrent, right ear: Secondary | ICD-10-CM

## 2020-04-10 LAB — COMPREHENSIVE METABOLIC PANEL
ALT: 28 IU/L (ref 0–44)
AST: 23 IU/L (ref 0–40)
Albumin/Globulin Ratio: 2 (ref 1.2–2.2)
Albumin: 4.7 g/dL (ref 4.0–5.0)
Alkaline Phosphatase: 58 IU/L (ref 39–117)
BUN/Creatinine Ratio: 14 (ref 9–20)
BUN: 13 mg/dL (ref 6–24)
Bilirubin Total: 0.9 mg/dL (ref 0.0–1.2)
CO2: 25 mmol/L (ref 20–29)
Calcium: 9.8 mg/dL (ref 8.7–10.2)
Chloride: 101 mmol/L (ref 96–106)
Creatinine, Ser: 0.93 mg/dL (ref 0.76–1.27)
GFR calc Af Amer: 117 mL/min/{1.73_m2} (ref >59)
GFR calc non Af Amer: 102 mL/min/{1.73_m2} (ref >59)
Globulin, Total: 2.4 g/dL (ref 1.5–4.5)
Glucose: 103 mg/dL — ABNORMAL HIGH (ref 65–99)
Potassium: 4.6 mmol/L (ref 3.5–5.2)
Sodium: 138 mmol/L (ref 134–144)
Total Protein: 7.1 g/dL (ref 6.0–8.5)

## 2020-04-10 LAB — LIPID PANEL
Chol/HDL Ratio: 4.4 ratio (ref 0.0–5.0)
Cholesterol, Total: 174 mg/dL (ref 100–199)
HDL: 40 mg/dL (ref >39)
LDL Chol Calc (NIH): 110 mg/dL — ABNORMAL HIGH (ref 0–99)
Triglycerides: 132 mg/dL (ref 0–149)
VLDL Cholesterol Cal: 24 mg/dL (ref 5–40)

## 2020-04-10 LAB — CBC WITH DIFFERENTIAL/PLATELET
Basophils Absolute: 0.1 10*3/uL (ref 0.0–0.2)
Basos: 1 %
EOS (ABSOLUTE): 0.1 10*3/uL (ref 0.0–0.4)
Eos: 1 %
Hematocrit: 48.1 % (ref 37.5–51.0)
Hemoglobin: 16.9 g/dL (ref 13.0–17.7)
Immature Grans (Abs): 0 10*3/uL (ref 0.0–0.1)
Immature Granulocytes: 1 %
Lymphocytes Absolute: 2.4 10*3/uL (ref 0.7–3.1)
Lymphs: 39 %
MCH: 32.4 pg (ref 26.6–33.0)
MCHC: 35.1 g/dL (ref 31.5–35.7)
MCV: 92 fL (ref 79–97)
Monocytes Absolute: 0.6 10*3/uL (ref 0.1–0.9)
Monocytes: 10 %
Neutrophils Absolute: 2.8 10*3/uL (ref 1.4–7.0)
Neutrophils: 48 %
Platelets: 234 10*3/uL (ref 150–450)
RBC: 5.21 x10E6/uL (ref 4.14–5.80)
RDW: 11.9 % (ref 11.6–15.4)
WBC: 6 10*3/uL (ref 3.4–10.8)

## 2020-04-10 LAB — TSH: TSH: 2.63 u[IU]/mL (ref 0.450–4.500)

## 2020-04-10 LAB — VITAMIN D 25 HYDROXY (VIT D DEFICIENCY, FRACTURES): Vit D, 25-Hydroxy: 27.1 ng/mL — ABNORMAL LOW (ref 30.0–100.0)

## 2020-04-10 LAB — HGB A1C W/O EAG: Hgb A1c MFr Bld: 5.1 % (ref 4.8–5.6)

## 2020-04-10 MED ORDER — FEXOFENADINE HCL 180 MG PO TABS: 180.0000 mg | ORAL_TABLET | Freq: Every day | ORAL | 1 refills | Status: DC

## 2020-04-10 MED ORDER — AMOXICILLIN 875 MG PO TABS: 875.0000 mg | ORAL_TABLET | Freq: Two times a day (BID) | ORAL | 0 refills | Status: DC

## 2020-04-10 NOTE — Patient Instructions (Signed)
Please hold the Zyrtec and start the generic Allegra (Fexofenadine) I've sent in for you Take all meds as directed Fluids and rest and avoid a lot of outside activity if you are able to until the pollen improves Encouraged patient to call the office or primary care doctor for an appointment if no improvement in symptoms or if symptoms change or worsen after 72 hours of planned treatment. Patient verbalized understanding of all instructions given/reviewed and has no further questions or concerns at this time.

## 2020-04-10 NOTE — Progress Notes (Signed)
   Subjective:    Patient ID: Tyler Hobbs, male    DOB: 09-26-1981, 39 y.o.   MRN: [id number]  HPI Tyler Hobbs comes to the [facility] clinic today with c/o right ear pain that started last night. He reports he had some leftover abx ear gtts in which he instilled them last night with relief but the pain returned this morning. He reports recurrent ear infections since a child and had tubes at that time. He reports he saw an ENT when he lived in [place] and has been her 5 years and has done fairly well not need allergy shots; which he used to get. Today he endorse facial pain but admits some pressure, watery eyes, sneezing, and scratchy throat. He denies SOB, wheezing, left ear pain.    Review of Systems  Constitutional: Negative for fever.  HENT: Positive for ear pain, sinus pressure and sneezing. Negative for ear discharge.   Eyes:       Watery eyes  Respiratory: Negative for cough, shortness of breath and wheezing.   Cardiovascular: Negative for chest pain.       Objective:   Physical Exam Constitutional:      General: He is not in acute distress.    Appearance: Normal appearance. He is not ill-appearing.  HENT:     Head: Normocephalic and atraumatic.     Right Ear: Ear canal normal.     Left Ear: Ear canal normal.     Ears:     Comments: Left TM intact with no erythema and clear serous fluid behind TM. Right TM with some erythema and bulging with clear fluid behind the TM.    Nose: Nose normal.     Mouth/Throat:     Mouth: Mucous membranes are moist.     Pharynx: Oropharynx is clear. No oropharyngeal exudate or posterior oropharyngeal erythema.  Cardiovascular:     Rate and Rhythm: Normal rate and regular rhythm.     Heart sounds: Normal heart sounds.  Pulmonary:     Effort: Pulmonary effort is normal.     Breath sounds: Normal breath sounds.  Abdominal:     General: Abdomen is flat. Bowel sounds are normal.  Musculoskeletal:     Cervical back: Neck supple. No tenderness.   Lymphadenopathy:     Cervical: Cervical adenopathy present.  Skin:    General: Skin is warm and dry.  Neurological:     General: No focal deficit present.     Mental Status: He is alert and oriented to person, place, and time.  Psychiatric:        Mood and Affect: Mood normal.        Behavior: Behavior normal.           Assessment & Plan:

## 2020-04-15 ENCOUNTER — Encounter: Payer: Self-pay | Admitting: Nurse Practitioner

## 2020-04-16 ENCOUNTER — Ambulatory Visit: Payer: BC Managed Care – PPO | Admitting: Nurse Practitioner

## 2020-04-16 ENCOUNTER — Other Ambulatory Visit: Payer: Self-pay | Admitting: Nurse Practitioner

## 2020-04-16 ENCOUNTER — Encounter: Payer: Self-pay | Admitting: Nurse Practitioner

## 2020-04-16 DIAGNOSIS — H66004 Acute suppurative otitis media without spontaneous rupture of ear drum, recurrent, right ear: Secondary | ICD-10-CM

## 2020-04-16 MED ORDER — CEFDINIR 300 MG PO CAPS: 300.0000 mg | ORAL_CAPSULE | Freq: Two times a day (BID) | ORAL | 0 refills | Status: DC

## 2020-04-16 NOTE — Progress Notes (Signed)
Pt sent mychart message reporting his symptoms have not improved. French Ana, RN has called pt to let him know another antibiotic will be sent in to replace current Amoxicillin and to take as directed. If symptoms still remain after completion or more than 3 days on then he is to come into office for another evaluation.

## 2020-04-30 ENCOUNTER — Other Ambulatory Visit: Payer: Self-pay | Admitting: Family

## 2020-04-30 ENCOUNTER — Encounter: Payer: Self-pay | Admitting: Family

## 2020-04-30 DIAGNOSIS — F988 Other specified behavioral and emotional disorders with onset usually occurring in childhood and adolescence: Secondary | ICD-10-CM

## 2020-04-30 NOTE — Telephone Encounter (Signed)
Refill request for RITALIN, last seen 03-17-20, last filled 02-07-20.  Please advise.

## 2020-05-01 MED ORDER — METHYLPHENIDATE HCL 10 MG PO TABS: ORAL_TABLET | ORAL | 0 refills | Status: DC

## 2020-06-04 DIAGNOSIS — F4321 Adjustment disorder with depressed mood: Secondary | ICD-10-CM | POA: Diagnosis not present

## 2020-06-05 ENCOUNTER — Encounter: Payer: Self-pay | Admitting: Nurse Practitioner

## 2020-06-05 ENCOUNTER — Ambulatory Visit: Payer: BC Managed Care – PPO | Admitting: Nurse Practitioner

## 2020-06-05 ENCOUNTER — Other Ambulatory Visit: Payer: Self-pay

## 2020-06-05 VITALS — BP 138/100 | HR 88 | Temp 97.8°F | Resp 20 | Ht 72.0 in | Wt 193.0 lb

## 2020-06-05 DIAGNOSIS — F5102 Adjustment insomnia: Secondary | ICD-10-CM

## 2020-06-05 DIAGNOSIS — F321 Major depressive disorder, single episode, moderate: Secondary | ICD-10-CM

## 2020-06-05 DIAGNOSIS — F430 Acute stress reaction: Secondary | ICD-10-CM

## 2020-06-05 MED ORDER — HYDROXYZINE HCL 50 MG PO TABS: 50.0000 mg | ORAL_TABLET | Freq: Three times a day (TID) | ORAL | 0 refills | Status: DC | PRN

## 2020-06-05 MED ORDER — SERTRALINE HCL 50 MG PO TABS: 50.0000 mg | ORAL_TABLET | Freq: Every day | ORAL | 0 refills | Status: DC

## 2020-06-05 NOTE — Progress Notes (Signed)
   Subjective:    Patient ID: Tyler Hobbs, male    DOB: [birth year], 39 y.o.   MRN: [id number]  HPI Tyler Hobbs comes to the [facility] clinic with c/o not eating or sleeping. He endorses that his wife is requesting a divorce and he was told on 05/22/20, she's currently in [facility] and he's suspicious that she's having an affair. He has a 70 y.o. daughter and she's currently staying with the inlaws. Since that time he endorses his wife has only been home about a week and unsure where she's getting the money because she isn't working. He feels a liveme app. has contributed to the current situation that started about 6 months ago. He admits he and his wife have tried marriage counseling in the past about 5 years ago. At this time, he reports she does not want to try this again and he also feels continuing the relationship is "toxic". He would like to try family counseling but his wife does not wish to do this. He has since moved out of the master's bedroom and is expecting his wife to return home tomorrow night in which his wife and daughter will go to [place] for a week. He does plan to catch up with friends during this time which have been a great support system along with his family who live [place]. He plans to visit them next month with his daughter. He had reached out to [facility] and will continue his 5 sessions there but is also now in counseling with [facility] and had his 1st session yesterday; which he will attend every week. He felt this was helpful. Tyler Hobbs endorses that he knows this is affecting his daughter because she always requesting him to watch a movie with her. He plans to spend the evening with his daughter.  PHQ9: 17 and GAD7: 14 with reports per his questionaire that he has a very difficult time with daily functions of life. Has lost his appetite and has lost 20 lbs since 5/28 and isn't sleeping. He denies SI/HI and admits his love for his daughter will not allow him to think anything like that. He  denies having any guns in the home.  Note b/p is elevated today d/t "CC"  Review of Systems  Constitutional: Positive for appetite change and unexpected weight change.  Psychiatric/Behavioral: Positive for sleep disturbance. Negative for decreased concentration, self-injury and suicidal ideas. The patient is nervous/anxious and is hyperactive.        Objective:   Physical Exam Constitutional:      General: He is not in acute distress.    Appearance: Normal appearance. He is not ill-appearing.  HENT:     Head: Normocephalic and atraumatic.  Musculoskeletal:     Cervical back: Neck supple.  Neurological:     Mental Status: He is alert and oriented to person, place, and time.  Psychiatric:     Comments: Mood is congruent with affect. Has fair eye contact and tearful. Thought and judgment are fair. He's restless.           Assessment & Plan:  Situational disturbance, MDD, insomnia: Will initiate Sertraline and f/u in 2 weeks. The course of treatment was outlined along with common side effects to include the BB warning. Healthy coping mechanisms is encouraged and to focus on self and his daughter. Discussed with [name] that sleep and appetite are impaired because of his depressive symptoms and should start to improve as his mood improves. To continue counseling.

## 2020-06-05 NOTE — Patient Instructions (Addendum)
Take all meds as directed I have discussed the 2 meds in detail and if you have any issues before the 2 weeks, please call Continue counseling Healthy coping mechanisms discussed and encouraged Return to the clinic in 2 weeks  Adjustment Disorder, Adult Adjustment disorder is a group of symptoms that can develop after a stressful life event, such as the loss of a job or serious physical illness. The symptoms can affect how you feel, think, and act. They may interfere with your relationships. Adjustment disorder increases your risk of suicide and substance abuse. If this disorder is not managed early, it can develop into a more serious condition, such as major depressive disorder or post-traumatic stress disorder. What are the causes? This condition happens when you have trouble recovering from or coping with a stressful life event. What increases the risk? You are more likely to develop this condition if:  You have had depression or anxiety.  You are being treated for a long-term (chronic) illness.  You are being treated for an illness that cannot be cured (terminal illness).  You have a family history of mental illness. What are the signs or symptoms? Symptoms of this condition include:  Extreme trouble doing daily tasks, such as going to work.  Sadness, depression, or crying spells.  Worrying a lot.  Loss of enjoyment.  Change in appetite or weight.  Feelings of loss or hopelessness.  Thoughts of suicide.  Anxiety, worry, or nervousness.  Trouble sleeping.  Avoiding family and friends.  Fighting or vandalism.  Complaining of feeling sick without being ill.  Feeling dazed or disconnected.  Nightmares.  Trouble sleeping.  Irritability.  Reckless driving.  Poor work International aid/development workerperformance.  Ignoring bills. Symptoms of this condition start within three months of the stressful event. They do not last more than six months, unless the stressful circumstances last longer.  Normal grieving after the death of a loved one is not a symptom of this condition. How is this diagnosed? To diagnose this condition, your health care provider will ask about what has happened in your life and how it has affected you. He or she may also ask about your medical history and your use of medicines, alcohol, and other substances. Your health care provider may do a physical exam and order lab tests or other studies. You may be referred to a mental health specialist. How is this treated?  Treatment options for this condition include:  Counseling or talk therapy. Talk therapy is usually provided by mental health specialists.  Medicines. Certain medicines may help with depression, anxiety, and sleep.  Support groups. These offer emotional support, advice, and guidance. They are made up of people who have had similar experiences.  Observation and time. This is sometimes called "watchful waiting." In this treatment, health care providers monitor your health and behavior without other treatment. Adjustment disorder sometimes gets better on its own with time. Follow these instructions at home:  Take over-the-counter and prescription medicines only as told by your health care provider.  Keep all follow-up visits as told by your health care provider. This is important. Contact a health care provider if:  Your symptoms do not improve in six months.  Your symptoms get worse. Get help right away if:  You have serious thoughts about hurting yourself or someone else. If you ever feel like you may hurt yourself or others, or have thoughts about taking your own life, get help right away. You can go to your nearest emergency department or call:  Your local emergency services (911 in the [place]  A suicide crisis helpline, such as the National Suicide Prevention Lifeline at (316) 709-2436. This is open 24 hours a day. Summary  Adjustment disorder is a group of symptoms that can develop after  a stressful life event, such as the loss of a job or serious physical illness. The symptoms can affect how you feel, think, and act. They may interfere with your relationships.  Symptoms of this condition start within three months of the stressful event. They do not last more than six months, unless the stressful circumstances last longer.  Treatment may include talk therapy, medicines, participation in a support group, or observation to see if symptoms improve.  Contact your health care provider if your symptoms get worse or do not improve in six months.  If you ever feel like you may hurt yourself or others, or have thoughts about taking your own life, get help right away. This information is not intended to replace advice given to you by your health care provider. Make sure you discuss any questions you have with your health care provider. Document Revised: 11/24/2017 Document Reviewed: 02/10/2017 Elsevier Patient Education  2020 [employer]   Mindfulness-Based Stress Reduction Mindfulness-based stress reduction (MBSR) is a program that helps people learn to practice mindfulness. Mindfulness is the practice of intentionally paying attention to the present moment. It can be learned and practiced through techniques such as education, breathing exercises, meditation, and yoga. MBSR includes several mindfulness techniques in one program. MBSR works best when you understand the treatment, are willing to try new things, and can commit to spending time practicing what you learn. MBSR training may include learning about: How your emotions, thoughts, and reactions affect your body. New ways to respond to things that cause negative thoughts to start (triggers). How to notice your thoughts and let go of them. Practicing awareness of everyday things that you normally do without thinking. The techniques and goals of different types of meditation. What are the benefits of MBSR? MBSR can have many  benefits, which include helping you to: Develop self-awareness. This refers to knowing and understanding yourself. Learn skills and attitudes that help you to participate in your own health care. Learn new ways to care for yourself. Be more accepting about how things are, and let things go. Be less judgmental and approach things with an open mind. Be patient with yourself and trust yourself more. MBSR has also been shown to: Reduce negative emotions, such as depression and anxiety. Improve memory and focus. Change how you sense and approach pain. Boost your body's ability to fight infections. Help you connect better with other people. Improve your sense of well-being. Follow these instructions at home:  Find a local in-person or online MBSR program. Set aside some time regularly for mindfulness practice. Find a mindfulness practice that works best for you. This may include one or more of the following: Meditation. Meditation involves focusing your mind on a certain thought or activity. Breathing awareness exercises. These help you to stay present by focusing on your breath. Body scan. For this practice, you lie down and pay attention to each part of your body from head to toe. You can identify tension and soreness and intentionally relax parts of your body. Yoga. Yoga involves stretching and breathing, and it can improve your ability to move and be flexible. It can also provide an experience of testing your body's limits, which can help you release stress. Mindful eating. This way of  eating involves focusing on the taste, texture, color, and smell of each bite of food. Because this slows down eating and helps you feel full sooner, it can be an important part of a weight-loss plan. Find a podcast or recording that provides guidance for breathing awareness, body scan, or meditation exercises. You can listen to these any time when you have a free moment to rest without distractions. Follow your  treatment plan as told by your health care provider. This may include taking regular medicines and making changes to your diet or lifestyle as recommended. How to practice mindfulness To do a basic awareness exercise: Find a comfortable place to sit. Pay attention to the present moment. Observe your thoughts, feelings, and surroundings just as they are. Avoid placing judgment on yourself, your feelings, or your surroundings. Make note of any judgment that comes up, and let it go. Your mind may wander, and that is okay. Make note of when your thoughts drift, and return your attention to the present moment. To do basic mindfulness meditation: Find a comfortable place to sit. This may include a stable chair or a firm floor cushion. Sit upright with your back straight. Let your arms fall next to your side with your hands resting on your legs. If sitting in a chair, rest your feet flat on the floor. If sitting on a cushion, cross your legs in front of you. Keep your head in a neutral position with your chin dropped slightly. Relax your jaw and rest the tip of your tongue on the roof of your mouth. Drop your gaze to the floor. You can close your eyes if you like. Breathe normally and pay attention to your breath. Feel the air moving in and out of your nose. Feel your belly expanding and relaxing with each breath. Your mind may wander, and that is okay. Make note of when your thoughts drift, and return your attention to your breath. Avoid placing judgment on yourself, your feelings, or your surroundings. Make note of any judgment or feelings that come up, let them go, and bring your attention back to your breath. When you are ready, lift your gaze or open your eyes. Pay attention to how your body feels after the meditation. Where to find more information You can find more information about MBSR from: Your health care provider. Community-based meditation centers or programs. Programs offered near  you. Summary Mindfulness-based stress reduction (MBSR) is a program that teaches you how to intentionally pay attention to the present moment. It is used with other treatments to help you cope better with daily stress, emotions, and pain. MBSR focuses on developing self-awareness, which allows you to respond to life stress without judgment or negative emotions. MBSR programs may involve learning different mindfulness practices, such as breathing exercises, meditation, yoga, body scan, or mindful eating. Find a mindfulness practice that works best for you, and set aside time for it on a regular basis. This information is not intended to replace advice given to you by your health care provider. Make sure you discuss any questions you have with your health care provider. Document Revised: 11/24/2017 Document Reviewed: 04/20/2017 Elsevier Patient Education  2020 [employer]  Mindfulness-Based Stress Reduction Mindfulness-based stress reduction (MBSR) is a program that helps people learn to practice mindfulness. Mindfulness is the practice of intentionally paying attention to the present moment. It can be learned and practiced through techniques such as education, breathing exercises, meditation, and yoga. MBSR includes several mindfulness techniques in one program.  MBSR works best when you understand the treatment, are willing to try new things, and can commit to spending time practicing what you learn. MBSR training may include learning about:  How your emotions, thoughts, and reactions affect your body.  New ways to respond to things that cause negative thoughts to start (triggers).  How to notice your thoughts and let go of them.  Practicing awareness of everyday things that you normally do without thinking.  The techniques and goals of different types of meditation. What are the benefits of MBSR? MBSR can have many benefits, which include helping you to:  Develop self-awareness. This  refers to knowing and understanding yourself.  Learn skills and attitudes that help you to participate in your own health care.  Learn new ways to care for yourself.  Be more accepting about how things are, and let things go.  Be less judgmental and approach things with an open mind.  Be patient with yourself and trust yourself more. MBSR has also been shown to:  Reduce negative emotions, such as depression and anxiety.  Improve memory and focus.  Change how you sense and approach pain.  Boost your body's ability to fight infections.  Help you connect better with other people.  Improve your sense of well-being. Follow these instructions at home:    Find a local in-person or online MBSR program.  Set aside some time regularly for mindfulness practice.  Find a mindfulness practice that works best for you. This may include one or more of the following: ? Meditation. Meditation involves focusing your mind on a certain thought or activity. ? Breathing awareness exercises. These help you to stay present by focusing on your breath. ? Body scan. For this practice, you lie down and pay attention to each part of your body from head to toe. You can identify tension and soreness and intentionally relax parts of your body. ? Yoga. Yoga involves stretching and breathing, and it can improve your ability to move and be flexible. It can also provide an experience of testing your body's limits, which can help you release stress. ? Mindful eating. This way of eating involves focusing on the taste, texture, color, and smell of each bite of food. Because this slows down eating and helps you feel full sooner, it can be an important part of a weight-loss plan.  Find a podcast or recording that provides guidance for breathing awareness, body scan, or meditation exercises. You can listen to these any time when you have a free moment to rest without distractions.  Follow your treatment plan as told  by your health care provider. This may include taking regular medicines and making changes to your diet or lifestyle as recommended. How to practice mindfulness To do a basic awareness exercise:  Find a comfortable place to sit.  Pay attention to the present moment. Observe your thoughts, feelings, and surroundings just as they are.  Avoid placing judgment on yourself, your feelings, or your surroundings. Make note of any judgment that comes up, and let it go.  Your mind may wander, and that is okay. Make note of when your thoughts drift, and return your attention to the present moment. To do basic mindfulness meditation:  Find a comfortable place to sit. This may include a stable chair or a firm floor cushion. ? Sit upright with your back straight. Let your arms fall next to your side with your hands resting on your legs. ? If sitting in a chair, rest your  feet flat on the floor. ? If sitting on a cushion, cross your legs in front of you.  Keep your head in a neutral position with your chin dropped slightly. Relax your jaw and rest the tip of your tongue on the roof of your mouth. Drop your gaze to the floor. You can close your eyes if you like.  Breathe normally and pay attention to your breath. Feel the air moving in and out of your nose. Feel your belly expanding and relaxing with each breath.  Your mind may wander, and that is okay. Make note of when your thoughts drift, and return your attention to your breath.  Avoid placing judgment on yourself, your feelings, or your surroundings. Make note of any judgment or feelings that come up, let them go, and bring your attention back to your breath.  When you are ready, lift your gaze or open your eyes. Pay attention to how your body feels after the meditation. Where to find more information You can find more information about MBSR from:  Your health care provider.  Community-based meditation centers or programs.  Programs offered  near you. Summary  Mindfulness-based stress reduction (MBSR) is a program that teaches you how to intentionally pay attention to the present moment. It is used with other treatments to help you cope better with daily stress, emotions, and pain.  MBSR focuses on developing self-awareness, which allows you to respond to life stress without judgment or negative emotions.  MBSR programs may involve learning different mindfulness practices, such as breathing exercises, meditation, yoga, body scan, or mindful eating. Find a mindfulness practice that works best for you, and set aside time for it on a regular basis. This information is not intended to replace advice given to you by your health care provider. Make sure you discuss any questions you have with your health care provider. Document Revised: 11/24/2017 Document Reviewed: 04/20/2017 Elsevier Patient Education  [employer].

## 2020-06-10 DIAGNOSIS — G4733 Obstructive sleep apnea (adult) (pediatric): Secondary | ICD-10-CM | POA: Diagnosis not present

## 2020-06-11 DIAGNOSIS — F4321 Adjustment disorder with depressed mood: Secondary | ICD-10-CM | POA: Diagnosis not present

## 2020-06-18 DIAGNOSIS — F4323 Adjustment disorder with mixed anxiety and depressed mood: Secondary | ICD-10-CM | POA: Diagnosis not present

## 2020-06-19 ENCOUNTER — Other Ambulatory Visit: Payer: Self-pay

## 2020-06-19 ENCOUNTER — Ambulatory Visit: Payer: BC Managed Care – PPO | Admitting: Nurse Practitioner

## 2020-06-19 ENCOUNTER — Encounter: Payer: Self-pay | Admitting: Nurse Practitioner

## 2020-06-19 VITALS — BP 149/95 | HR 82 | Temp 97.5°F | Resp 16 | Wt 184.0 lb

## 2020-06-19 DIAGNOSIS — G4709 Other insomnia: Secondary | ICD-10-CM

## 2020-06-19 DIAGNOSIS — F339 Major depressive disorder, recurrent, unspecified: Secondary | ICD-10-CM

## 2020-06-19 DIAGNOSIS — F430 Acute stress reaction: Secondary | ICD-10-CM

## 2020-06-19 DIAGNOSIS — F411 Generalized anxiety disorder: Secondary | ICD-10-CM

## 2020-06-19 MED ORDER — MELATONIN 5 MG PO TABS: ORAL_TABLET | ORAL | 0 refills | Status: DC

## 2020-06-19 MED ORDER — SERTRALINE HCL 100 MG PO TABS: 100.0000 mg | ORAL_TABLET | Freq: Every day | ORAL | 0 refills | Status: DC

## 2020-06-19 NOTE — Patient Instructions (Addendum)
Tyler Hobbs, we've discussed your blood pressure today and although it's a result of your current stress and situation if it doesn't improve we will need to consider treatment for it.  Continue to keep your caffeine to a minimum Start b-complex vitamins daily The dry mouth could have been from the Hydroxyzine as well so it may even resolve on its own, but do lozenges and stay hydrated Take 2 Sertraline tabs until you complete the current bottle, then 1 tab of your new dose (100mg ) Stop the Hydroxyzine and start Melatonin that I've sent to the pharmacy Continue the sleep hygiene recommendations When you find your mind is racing: journal your thoughts or try to redirect your thoughts or go spend time with your daughter Return to the clinic if 2 weeks for follow up or sooner

## 2020-06-19 NOTE — Progress Notes (Signed)
Subjective:    Patient ID: Tyler Hobbs, male    DOB: Aug 03, 1981, 39 y.o.   MRN: [id number]  HPI Tyler Hobbs comes to the [facility] clinic today for f/u on situational disturbance, insomnia, and MDD with anxious symptoms.  Reports he feels Zoloft is working and wants this to be increased today.  He feels the Hydroxyzine isn't helping with sleep and caused him to be more anxious but feel it has helped with his allergies. He's admitting dry mouth symptoms but drinks lots of water and denies any other SE. He reports he only sleeping about 4 hours a night due to increased stress and finding out more things in the last few weeks regarding his relationship with his wife. Tyler Hobbs endorses that he has served his wife papers regarding their current relationship status. He reports he's started walking, continues therapy weekly. He has a much stronger relationship with his father and a strong support system. He reports he talks to his dad about "10 times a day" and it has really helped. He plans to go on vacation this weekend. He endorses working through some unresolved feelings of when he was growing up and his parents knew he struggled with ADD but did not acknowledge it because of the stigma and his struggles because of lack of focus. 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 He's been on his Ritalin since around 2000 with recent slight change. He acknowledges racing thoughts and more anxious symptoms today. He continues to spend a lot of time with his daughter and he and his wife relationship has gotten "messy". He does reports he's able to function and get through his day. He denies any SI/HI.  GAD7 up from 14 to 16 PHQ9: improved 17 to 13   Review of Systems  Constitutional: Positive for fatigue. Negative for unexpected weight change.       Appetite is improving  HENT: Negative for congestion.   Eyes: Negative for visual disturbance.  Respiratory:  Negative for shortness of breath.   Cardiovascular: Negative for chest pain.  Gastrointestinal: Negative for abdominal pain, diarrhea, nausea and vomiting.  Skin: Negative for rash.  Neurological: Negative for headaches.  Psychiatric/Behavioral: Positive for decreased concentration and sleep disturbance. Negative for self-injury and suicidal ideas. The patient is nervous/anxious.        Objective:   Physical Exam Constitutional:      General: He is not in acute distress.    Appearance: Normal appearance. He is not ill-appearing, toxic-appearing or diaphoretic.  HENT:     Head: Normocephalic and atraumatic.  Cardiovascular:     Pulses: Normal pulses.     Heart sounds: Normal heart sounds.  Pulmonary:     Effort: Pulmonary effort is normal.     Breath sounds: Normal breath sounds.  Abdominal:     General: Bowel sounds are normal.     Palpations: Abdomen is soft.  Skin:    General: Skin is warm and dry.  Neurological:     Mental Status: He is alert and oriented to person, place, and time.  Psychiatric:     Comments: Affect and mood are congruent. Restless  Rapid speech,  talkative with racing thoughts           Assessment & Plan:  Insomnia with MDD with anxious distress: depressive symptoms have improved with no tearfulness but anxiety has worsened. Sleeping still impaired and will d/c hydroxyzine, increase Zoloft  and expressed takes 4-6 for total effect, thus already being on it 2 weeks. And will start melatonin for sleep.

## 2020-06-22 LAB — CMP14+EGFR
ALT: 18 IU/L (ref 0–44)
AST: 19 IU/L (ref 0–40)
Albumin/Globulin Ratio: 1.7 (ref 1.2–2.2)
Albumin: 4.7 g/dL (ref 4.0–5.0)
Alkaline Phosphatase: 53 IU/L (ref 48–121)
BUN/Creatinine Ratio: 20 (ref 9–20)
BUN: 17 mg/dL (ref 6–24)
Bilirubin Total: 0.5 mg/dL (ref 0.0–1.2)
CO2: 20 mmol/L (ref 20–29)
Calcium: 9.8 mg/dL (ref 8.7–10.2)
Chloride: 100 mmol/L (ref 96–106)
Creatinine, Ser: 0.87 mg/dL (ref 0.76–1.27)
GFR calc Af Amer: 124 mL/min/{1.73_m2} (ref >59)
GFR calc non Af Amer: 107 mL/min/{1.73_m2} (ref >59)
Globulin, Total: 2.7 g/dL (ref 1.5–4.5)
Glucose: 111 mg/dL — ABNORMAL HIGH (ref 65–99)
Potassium: 5.5 mmol/L — ABNORMAL HIGH (ref 3.5–5.2)
Sodium: 138 mmol/L (ref 134–144)
Total Protein: 7.4 g/dL (ref 6.0–8.5)

## 2020-06-23 ENCOUNTER — Telehealth: Payer: Self-pay | Admitting: Nurse Practitioner

## 2020-06-23 NOTE — Telephone Encounter (Signed)
Spoke with patient about elevated potassium. Reviewed all medications and they aren't attributed to hyperkalemia. He denies taking daily MVI and haven't in some time. He admits eating a banana a day. Advised to hold bananas for now and discussed food with more potassium to cut back on for now and lets recheck K at next OV. His Zoloft was recently increased and he reports tolerating with no concerns. He admits that his sleep has improved however he continues to have trouble falling asleep and staying asleep. He wants to know if it's ok to take (2) 5 mg Melatonin. He has been instructed to increase the dose to 10mg  to help with insomnia. I will repeat CMP at next OV and he's to call the office before that time if he has any issues or concerns. Verbalized understanding.

## 2020-06-26 ENCOUNTER — Other Ambulatory Visit: Payer: Self-pay | Admitting: Nurse Practitioner

## 2020-06-26 DIAGNOSIS — F321 Major depressive disorder, single episode, moderate: Secondary | ICD-10-CM

## 2020-06-26 DIAGNOSIS — F430 Acute stress reaction: Secondary | ICD-10-CM

## 2020-07-02 DIAGNOSIS — F4323 Adjustment disorder with mixed anxiety and depressed mood: Secondary | ICD-10-CM | POA: Diagnosis not present

## 2020-07-03 ENCOUNTER — Other Ambulatory Visit: Payer: Self-pay

## 2020-07-03 ENCOUNTER — Encounter: Payer: Self-pay | Admitting: Nurse Practitioner

## 2020-07-03 ENCOUNTER — Ambulatory Visit: Payer: BC Managed Care – PPO | Admitting: Nurse Practitioner

## 2020-07-03 VITALS — BP 134/87 | HR 84 | Temp 98.3°F | Wt 180.6 lb

## 2020-07-03 DIAGNOSIS — F411 Generalized anxiety disorder: Secondary | ICD-10-CM

## 2020-07-03 NOTE — Progress Notes (Signed)
Subjective:    Patient ID: Tyler Hobbs, male    DOB: 08/31/1981, 39 y.o.   MRN: [id number]  HPI  Patient is here for 2 week follow up after having an increase in dosage of Zoloft. Has tolerated well denies SEs. Has also switched to Melatonin successfully was previously trying hydroxyzine without relief of insomnia. Also had elevated Potassium at last apt, he has stopped eating bananas.   This f/u is regarding situational onset of increased anxiety since the onset of his separation with his wife.  Yesterday he met with his lawyer and set boundaries with his wife regarding activities with daughter and what was acceptable and not, this provided him relief and decreased some worry/anxiety.  He is leaving for [facility] next week for vacation with daughter to visit family both brothers and parents who live there now. His family has been a great support system he admits that he did not have a great relationship with his Dad prior but over the past few months has been talking to him multiple times a day and has been receiving great support. He will be in [place] for two weeks.   He has been using 28m Melatonin that has helped him stay asleep a little bit more still averages 6 hours each night- he has also been exercising every morning and walking at night.   He is seeing a therapist weekly right now that is working with him to do sleep hygiene and manage anxiety as well.   He notes he is still living with his wife and that is causing a lot of stress as well, no current plan to physically separate, his lawyer has advised him to stay at the house.   Daughter is in summer school and the school has been made aware of the situation and she will also start counseling after vacation.   ROS Has noted some expected weight loss with increased exercise Appetite slowly improving   Resp: negative for SOB Card: Negative for chest pain GI: negative for complaints/disturbances  Phychiatric: positive for sleep  disturbance, anxiety/nervousness, decrease in pleasure with activities, negative for homicidal or suicidal ideation       Objective:   Physical Exam Constitutional:      Appearance: Normal appearance.  HENT:     Nose: Nose normal.  Cardiovascular:     Rate and Rhythm: Normal rate and regular rhythm.  Pulmonary:     Effort: Pulmonary effort is normal.     Breath sounds: Normal breath sounds.  Skin:    General: Skin is warm and dry.  Neurological:     Mental Status: He is alert and oriented to person, place, and time.  Psychiatric:        Attention and Perception: Perception normal.        Mood and Affect: Mood is anxious.        Speech: Speech normal.        Behavior: Behavior is cooperative.        Thought Content: Thought content normal.        Cognition and Memory: Cognition normal.        Judgment: Judgment normal.   GAD-7 score (16) PHQ9 score (14)    Assessment & Plan:   Patient will maintain current dosage of Zoloft, will continue melatonin as needed for sleep.  Continue to practice sleep hygiene.  Will continue meetings weekly with therapist Will f/u at wellness in two weeks when returning from vacation.  Will message provider or call  with any new concerns as discussed.  Provider to f/u with repeat  CMP results.

## 2020-07-04 LAB — COMPREHENSIVE METABOLIC PANEL
ALT: 21 IU/L (ref 0–44)
AST: 19 IU/L (ref 0–40)
Albumin/Globulin Ratio: 2.2 (ref 1.2–2.2)
Albumin: 4.8 g/dL (ref 4.0–5.0)
Alkaline Phosphatase: 59 IU/L (ref 48–121)
BUN/Creatinine Ratio: 19 (ref 9–20)
BUN: 14 mg/dL (ref 6–24)
Bilirubin Total: 0.4 mg/dL (ref 0.0–1.2)
CO2: 23 mmol/L (ref 20–29)
Calcium: 9.9 mg/dL (ref 8.7–10.2)
Chloride: 102 mmol/L (ref 96–106)
Creatinine, Ser: 0.75 mg/dL — ABNORMAL LOW (ref 0.76–1.27)
GFR calc Af Amer: 132 mL/min/{1.73_m2} (ref >59)
GFR calc non Af Amer: 114 mL/min/{1.73_m2} (ref >59)
Globulin, Total: 2.2 g/dL (ref 1.5–4.5)
Glucose: 111 mg/dL — ABNORMAL HIGH (ref 65–99)
Potassium: 4.4 mmol/L (ref 3.5–5.2)
Sodium: 141 mmol/L (ref 134–144)
Total Protein: 7 g/dL (ref 6.0–8.5)

## 2020-07-17 ENCOUNTER — Ambulatory Visit: Payer: BC Managed Care – PPO | Admitting: Nurse Practitioner

## 2020-07-20 ENCOUNTER — Ambulatory Visit: Payer: BC Managed Care – PPO | Admitting: Medical

## 2020-07-20 ENCOUNTER — Encounter: Payer: Self-pay | Admitting: Medical

## 2020-07-20 ENCOUNTER — Other Ambulatory Visit: Payer: Self-pay

## 2020-07-20 VITALS — BP 134/90 | HR 80 | Temp 97.7°F | Resp 16 | Wt 180.0 lb

## 2020-07-20 DIAGNOSIS — F329 Major depressive disorder, single episode, unspecified: Secondary | ICD-10-CM

## 2020-07-20 DIAGNOSIS — F411 Generalized anxiety disorder: Secondary | ICD-10-CM

## 2020-07-20 DIAGNOSIS — F32A Depression, unspecified: Secondary | ICD-10-CM

## 2020-07-20 DIAGNOSIS — R634 Abnormal weight loss: Secondary | ICD-10-CM

## 2020-07-20 NOTE — Progress Notes (Signed)
Subjective:    Patient ID: Tyler Hobbs, male    DOB: [birth year], 39 y.o.   MRN: [id number]  HPI 39 yo male with a history of anxiety and depression, seen previously in the clinic. Here is here for a follow up. Initially seen on 06/05/20 with Marnette Burgess FNP  on Cpap new machine more than  5 years, just got a new CPAP machine, which he feels has helped his sleep. He returns from traveling to [facility] where his family lives.  He is seeing a therapist every week , which is helping with sleep hygiene and anxiety.Currently patient is still in the same house as wife , but they are separated at this time. His Daughter will start counseling once school starts.      184- 180l bs  6 ft (even after eating his mom's home cooking) He says he ate regular meals while on vacation. He is not trying to loose weight.   Blood pressure (!) 134/90, pulse 80, temperature 97.7 F (36.5 C), temperature source Temporal, resp. rate 16, weight 180 lb (81.6 kg), SpO2 100 %.  No Known Allergies    Current Outpatient Medications:    b complex vitamins tablet, Take 1 tablet by mouth daily., Disp: , Rfl:    fexofenadine (ALLEGRA) 180 MG tablet, Take 1 tablet (180 mg total) by mouth daily., Disp: 30 tablet, Rfl: 1   fluticasone (FLONASE) 50 MCG/ACT nasal spray, Place 2 sprays into both nostrils daily., Disp: 16 g, Rfl: 6   melatonin 5 MG TABS, 1 tab PO every HS PRN. If ineffective, repeat dose in 1 hour (max 10mg ), Disp: 90 tablet, Rfl: 0   methylphenidate (RITALIN) 10 MG tablet, 1.5 tablets in the morning, 1.5 tablets at noon and 1 tablet in the evening., Disp: 120 tablet, Rfl: 0   methylphenidate (RITALIN) 10 MG tablet, 1.5 tablets in the morning, 1.5 tablets at noon and 1 tablet in the evening., Disp: 120 tablet, Rfl: 0   methylphenidate (RITALIN) 10 MG tablet, 1.5 tablets in the am, 1.5 tablets at noon, and 1 tablet in the evening., Disp: 120 tablet, Rfl: 0   Multiple  Vitamins-Minerals (MULTIVITAMIN GUMMIES ADULTS) CHEW, Chew by mouth 2 (two) times daily. , Disp: , Rfl:    sertraline (ZOLOFT) 100 MG tablet, Take 1 tablet (100 mg total) by mouth daily., Disp: 90 tablet, Rfl: 0   loratadine-pseudoephedrine (CLARITIN-D 24 HOUR) 10-240 MG per 24 hr tablet, Take 1 tablet by mouth as needed for allergies. (Patient not taking: Reported on 07/20/2020), Disp: , Rfl:  Review of Systems  Psychiatric/Behavioral: Negative for agitation, behavioral problems, confusion, decreased concentration, dysphoric mood, hallucinations, self-injury, sleep disturbance and suicidal ideas. The patient is nervous/anxious. The patient is not hyperactive.        Returned from a vacation with his daughter.       Objective:   Physical Exam Vitals and nursing note reviewed.  Constitutional:      Appearance: Normal appearance.  HENT:     Head: Normocephalic and atraumatic.  Eyes:     Extraocular Movements: Extraocular movements intact.     Conjunctiva/sclera: Conjunctivae normal.     Pupils: Pupils are equal, round, and reactive to light.  Pulmonary:     Effort: Pulmonary effort is normal.  Musculoskeletal:        General: Normal range of motion.     Cervical back: Normal range of motion.  Skin:    General: Skin is warm and dry.  Capillary Refill: Capillary refill takes less than 2 seconds.  Neurological:     General: No focal deficit present.     Mental Status: He is alert and oriented to person, place, and time. Mental status is at baseline.  Psychiatric:        Mood and Affect: Mood normal.        Behavior: Behavior normal.        Thought Content: Thought content normal.    Sitting calmly on exam table.   PHQ-9= 13 previously 17 GAD-7 10 previously 14 An increase of Zoloft on 06/19/2020. Also stopped hydroxyzine at that time. Switched over to Melatonin.         Assessment & Plan:  Depression and Anxiety Weight loss Referred patient to his primary care  provider (PCP) for continued care. He states he has plenty of medication at this time, he is taking  Zoloft 100mg  daily.. If he needs any care or has concerns to call and make an appointment with the [facility] and [facility] as needed. He states he is not trying to lose weight and has lost about  30 lbs this year since the separation. Encouraged patient to eat higher calorie foods and more often. I reviewed with the patient about his weight and that I would not want to see him lose any more.  He also let me know that [facility] had reviewed the medication in detail with him and I let him know about the black box warning with SSRI the class of medication he is taking. He is aware and denies any suicidal thoughts or ideals.  He verbalizes understanding and has no questions at discharge.

## 2020-07-21 DIAGNOSIS — F4323 Adjustment disorder with mixed anxiety and depressed mood: Secondary | ICD-10-CM | POA: Diagnosis not present

## 2020-07-23 DIAGNOSIS — F4323 Adjustment disorder with mixed anxiety and depressed mood: Secondary | ICD-10-CM | POA: Diagnosis not present

## 2020-07-27 ENCOUNTER — Encounter: Payer: Self-pay | Admitting: Medical

## 2020-07-28 ENCOUNTER — Other Ambulatory Visit: Payer: Self-pay | Admitting: Family

## 2020-07-28 DIAGNOSIS — F988 Other specified behavioral and emotional disorders with onset usually occurring in childhood and adolescence: Secondary | ICD-10-CM

## 2020-07-29 MED ORDER — METHYLPHENIDATE HCL 10 MG PO TABS: ORAL_TABLET | ORAL | 0 refills | Status: DC

## 2020-08-03 DIAGNOSIS — F4323 Adjustment disorder with mixed anxiety and depressed mood: Secondary | ICD-10-CM | POA: Diagnosis not present

## 2020-08-06 DIAGNOSIS — F4323 Adjustment disorder with mixed anxiety and depressed mood: Secondary | ICD-10-CM | POA: Diagnosis not present

## 2020-08-25 DIAGNOSIS — F4323 Adjustment disorder with mixed anxiety and depressed mood: Secondary | ICD-10-CM | POA: Diagnosis not present

## 2020-08-28 DIAGNOSIS — F4323 Adjustment disorder with mixed anxiety and depressed mood: Secondary | ICD-10-CM | POA: Diagnosis not present

## 2020-09-10 DIAGNOSIS — F4323 Adjustment disorder with mixed anxiety and depressed mood: Secondary | ICD-10-CM | POA: Diagnosis not present

## 2020-09-24 DIAGNOSIS — F4323 Adjustment disorder with mixed anxiety and depressed mood: Secondary | ICD-10-CM | POA: Diagnosis not present

## 2020-10-08 DIAGNOSIS — F4323 Adjustment disorder with mixed anxiety and depressed mood: Secondary | ICD-10-CM | POA: Diagnosis not present

## 2020-10-23 ENCOUNTER — Other Ambulatory Visit: Payer: Self-pay | Admitting: Family

## 2020-10-23 DIAGNOSIS — F988 Other specified behavioral and emotional disorders with onset usually occurring in childhood and adolescence: Secondary | ICD-10-CM

## 2020-10-25 DIAGNOSIS — F4323 Adjustment disorder with mixed anxiety and depressed mood: Secondary | ICD-10-CM | POA: Diagnosis not present

## 2020-10-27 ENCOUNTER — Other Ambulatory Visit: Payer: Self-pay | Admitting: Family

## 2020-10-27 ENCOUNTER — Telehealth: Payer: Self-pay | Admitting: Family

## 2020-10-27 DIAGNOSIS — F988 Other specified behavioral and emotional disorders with onset usually occurring in childhood and adolescence: Secondary | ICD-10-CM

## 2020-10-27 MED ORDER — METHYLPHENIDATE HCL 10 MG PO TABS: ORAL_TABLET | ORAL | 0 refills | Status: DC

## 2020-10-27 NOTE — Telephone Encounter (Signed)
Call pt Refilled ritalin He is very due for f/u  Please sch

## 2020-10-28 NOTE — Telephone Encounter (Signed)
Patient was informed via mychart.

## 2020-10-29 ENCOUNTER — Other Ambulatory Visit: Payer: Self-pay

## 2020-11-02 ENCOUNTER — Encounter: Payer: Self-pay | Admitting: Family

## 2020-11-02 ENCOUNTER — Other Ambulatory Visit: Payer: Self-pay

## 2020-11-02 ENCOUNTER — Ambulatory Visit: Payer: BC Managed Care – PPO | Admitting: Family

## 2020-11-02 DIAGNOSIS — F411 Generalized anxiety disorder: Secondary | ICD-10-CM | POA: Insufficient documentation

## 2020-11-02 DIAGNOSIS — F988 Other specified behavioral and emotional disorders with onset usually occurring in childhood and adolescence: Secondary | ICD-10-CM

## 2020-11-02 NOTE — Assessment & Plan Note (Signed)
Stable, continue ritalin 10mg .

## 2020-11-02 NOTE — Assessment & Plan Note (Signed)
Resolved at this time. No longer on zoloft nor feels that he needs to follow with psychiatry. He will let me know if he needs anything at all.

## 2020-11-02 NOTE — Patient Instructions (Signed)
Nice to you and happy holidays!

## 2020-11-02 NOTE — Progress Notes (Signed)
Subjective:    Patient ID: Tyler Hobbs, male    DOB: 06-01-1981, 39 y.o.   MRN: [id number]  CC: Tyler Hobbs is a 39 y.o. male who presents today for follow up.   HPI: Doing much better. Going through divorce and has recently moved into his own home.  He is feeling better now living on his own. Focused on his health and his daughter. He is exercising 6 days per week. He stopped taking zoloft and melatonin as didn't feel like he needed No si/hi  ADHD - compliant with ritalin. No trouble sleeping, palpitations.   Had been following with psychiatry [facility] however last visit is this week as he doesn't feel he needs to follow any more.     HISTORY:  Past Medical History:  Diagnosis Date  . Asthma   . Chicken pox   . Hay fever   . Multiple allergies   . OSA (obstructive sleep apnea)    Past Surgical History:  Procedure Laterality Date  . ADENOIDECTOMY    . TYMPANOSTOMY TUBE PLACEMENT     Family History  Problem Relation Age of Onset  . Hypertension Father   . Cancer Maternal Aunt        lung  . Cancer Paternal Aunt        breast  . Alcohol abuse Paternal Uncle   . Arthritis Maternal Grandfather   . Colon cancer Neg Hx   . Prostate cancer Neg Hx     Allergies: Patient has no known allergies. Current Outpatient Medications on File Prior to Visit  Medication Sig Dispense Refill  . fluticasone (FLONASE) 50 MCG/ACT nasal spray Place 2 sprays into both nostrils daily. 16 g 6  . loratadine (CLARITIN) 10 MG tablet Take 10 mg by mouth daily.    . methylphenidate (RITALIN) 10 MG tablet 1.5 tablets in the morning, 1.5 tablets at noon and 1 tablet in the evening. 120 tablet 0  . methylphenidate (RITALIN) 10 MG tablet 1.5 tablets in the morning, 1.5 tablets at noon and 1 tablet in the evening. 124 tablet 0  . methylphenidate (RITALIN) 10 MG tablet 1.5 tablets in the am, 1.5 tablets at noon, and 1 tablet in the evening. 124 tablet 0  . Multiple Vitamins-Minerals  (MULTIVITAMIN GUMMIES ADULTS) CHEW Chew by mouth 2 (two) times daily.      No current facility-administered medications on file prior to visit.    Social History   Tobacco Use  . Smoking status: Former Smoker    Quit date: 09/01/2001    Years since quitting: 19.1  . Smokeless tobacco: Never Used  Substance Use Topics  . Alcohol use: Yes    Alcohol/week: 3.0 - 4.0 standard drinks    Types: 3 - 4 Standard drinks or equivalent per week    Comment: both beer and liquor  . Drug use: Never    Comment: weed. extacy.    Review of Systems  Constitutional: Negative for chills and fever.  Respiratory: Negative for cough.   Cardiovascular: Negative for chest pain and palpitations.  Gastrointestinal: Negative for nausea and vomiting.  Psychiatric/Behavioral: Negative for suicidal ideas. The patient is not nervous/anxious.       Objective:    BP 122/82   Pulse 89   Temp 98.2 F (36.8 C)   Ht 6' (1.829 m)   Wt 177 lb 3.2 oz (80.4 kg)   SpO2 98%   BMI 24.03 kg/m  BP Readings from Last 3 Encounters:  11/02/20 122/82  07/20/20 (!) 134/90  07/03/20 134/87   Wt Readings from Last 3 Encounters:  11/02/20 177 lb 3.2 oz (80.4 kg)  07/20/20 180 lb (81.6 kg)  07/03/20 180 lb 9.6 oz (81.9 kg)    Physical Exam Vitals reviewed.  Constitutional:      Appearance: He is well-developed.  Cardiovascular:     Rate and Rhythm: Regular rhythm.     Heart sounds: Normal heart sounds.  Pulmonary:     Effort: Pulmonary effort is normal. No respiratory distress.     Breath sounds: Normal breath sounds. No wheezing, rhonchi or rales.  Skin:    General: Skin is warm and dry.  Neurological:     Mental Status: He is alert.  Psychiatric:        Speech: Speech normal.        Behavior: Behavior normal.        Assessment & Plan:   Problem List Items Addressed This Visit      Other   Adult attention deficit disorder    Stable, continue ritalin 10mg .       GAD (generalized anxiety  disorder)    Resolved at this time. No longer on zoloft nor feels that he needs to follow with psychiatry. He will let me know if he needs anything at all.           I have discontinued Jaramiah Paparella's loratadine-pseudoephedrine, fexofenadine, melatonin, sertraline, and b complex vitamins. I am also having him maintain his Multivitamin Gummies Adults, fluticasone, methylphenidate, methylphenidate, methylphenidate, and loratadine.   No orders of the defined types were placed in this encounter.   Return precautions given.   Risks, benefits, and alternatives of the medications and treatment plan prescribed today were discussed, and patient expressed understanding.   Education regarding symptom management and diagnosis given to patient on AVS.  Continue to follow with Luisa Hart, FNP for routine health maintenance.   Allegra Grana and I agreed with plan.   [name], FNP

## 2020-11-05 DIAGNOSIS — F4323 Adjustment disorder with mixed anxiety and depressed mood: Secondary | ICD-10-CM | POA: Diagnosis not present

## 2020-11-26 DIAGNOSIS — G4733 Obstructive sleep apnea (adult) (pediatric): Secondary | ICD-10-CM | POA: Diagnosis not present

## 2021-01-25 ENCOUNTER — Encounter: Payer: Self-pay | Admitting: Family

## 2021-01-25 ENCOUNTER — Telehealth: Payer: Self-pay | Admitting: Family

## 2021-01-25 DIAGNOSIS — F988 Other specified behavioral and emotional disorders with onset usually occurring in childhood and adolescence: Secondary | ICD-10-CM

## 2021-01-25 MED ORDER — METHYLPHENIDATE HCL 10 MG PO TABS: ORAL_TABLET | ORAL | 0 refills | Status: DC

## 2021-01-25 MED ORDER — METHYLPHENIDATE HCL 10 MG PO TABS
ORAL_TABLET | ORAL | 0 refills | Status: DC
Start: 2021-01-25 — End: 2021-02-17

## 2021-01-25 NOTE — Telephone Encounter (Signed)
I looked up patient on [place] Controlled Substances Reporting System PMP AWARE and saw no activity that raised concern of inappropriate use.   

## 2021-02-08 ENCOUNTER — Other Ambulatory Visit: Payer: Self-pay | Admitting: Family

## 2021-02-08 ENCOUNTER — Telehealth: Payer: Self-pay

## 2021-02-08 DIAGNOSIS — F988 Other specified behavioral and emotional disorders with onset usually occurring in childhood and adolescence: Secondary | ICD-10-CM

## 2021-02-08 MED ORDER — METHYLPHENIDATE HCL 10 MG PO TABS
ORAL_TABLET | ORAL | 0 refills | Status: DC
Start: 2021-02-08 — End: 2021-02-17

## 2021-02-08 MED ORDER — METHYLPHENIDATE HCL 10 MG PO TABS: ORAL_TABLET | ORAL | 0 refills | Status: DC

## 2021-02-08 NOTE — Telephone Encounter (Signed)
I faxed a RX for Ritalin to [facility] per Sena Hitch.confirmation given.  Meldrick Buttery,cma

## 2021-02-08 NOTE — Progress Notes (Signed)
I looked up patient on [place] Controlled Substances Reporting System PMP AWARE and saw no activity that raised concern of inappropriate use.   

## 2021-02-17 ENCOUNTER — Ambulatory Visit: Payer: BC Managed Care – PPO | Admitting: Family

## 2021-02-17 ENCOUNTER — Other Ambulatory Visit: Payer: Self-pay

## 2021-02-17 VITALS — BP 108/74 | HR 77 | Temp 97.4°F | Resp 16 | Wt 186.0 lb

## 2021-02-17 DIAGNOSIS — G4733 Obstructive sleep apnea (adult) (pediatric): Secondary | ICD-10-CM

## 2021-02-17 DIAGNOSIS — F988 Other specified behavioral and emotional disorders with onset usually occurring in childhood and adolescence: Secondary | ICD-10-CM | POA: Diagnosis not present

## 2021-02-17 DIAGNOSIS — J302 Other seasonal allergic rhinitis: Secondary | ICD-10-CM | POA: Diagnosis not present

## 2021-02-17 MED ORDER — METHYLPHENIDATE HCL 10 MG PO TABS: ORAL_TABLET | ORAL | 0 refills | Status: DC

## 2021-02-17 NOTE — Assessment & Plan Note (Signed)
Compliant with cipap. Fatigue improved

## 2021-02-17 NOTE — Patient Instructions (Signed)
Nice to see you!   

## 2021-02-17 NOTE — Assessment & Plan Note (Addendum)
Well controlled. Continue ritalin 10mg . I looked up patient on [place] Controlled Substances Reporting System PMP AWARE and saw no activity that raised concern of inappropriate use.  Refilled medication for 3 months.

## 2021-02-17 NOTE — Progress Notes (Signed)
Subjective:    Patient ID: Tyler Hobbs, male    DOB: [birth year], 40 y.o.   MRN: [id number]  CC: Tyler Hobbs is a 40 y.o. male who presents today for follow up.   HPI: Feels well today. No complaints   ADHD- compliant with ritalin 10mg  daily. No increased anxiety. Of late trouble falling asleep as boss is on medical leave and he is working longer hours. He is also enrolled in [facility] program. He has started melatonin for this.   Seasonal allergies- using flonase , claritin prn seasonal. He hasnt required inhaler since he has moved to [place].   OSA- compliant with Cipap nightly, wearing at least 4 hours per night. Energy has improved.    HISTORY:  Past Medical History:  Diagnosis Date  . Asthma   . Chicken pox   . Hay fever   . Multiple allergies   . OSA (obstructive sleep apnea)    Past Surgical History:  Procedure Laterality Date  . ADENOIDECTOMY    . TYMPANOSTOMY TUBE PLACEMENT     Family History  Problem Relation Age of Onset  . Hypertension Father   . Cancer Maternal Aunt        lung  . Cancer Paternal Aunt        breast  . Alcohol abuse Paternal Uncle   . Arthritis Maternal Grandfather   . Colon cancer Neg Hx   . Prostate cancer Neg Hx     Allergies: Patient has no known allergies. Current Outpatient Medications on File Prior to Visit  Medication Sig Dispense Refill  . fluticasone (FLONASE) 50 MCG/ACT nasal spray Place 2 sprays into both nostrils daily. 16 g 6  . loratadine (CLARITIN) 10 MG tablet Take 10 mg by mouth daily.    . Multiple Vitamins-Minerals (MULTIVITAMIN GUMMIES ADULTS) CHEW Chew by mouth 2 (two) times daily.      No current facility-administered medications on file prior to visit.    Social History   Tobacco Use  . Smoking status: Former Smoker    Quit date: 09/01/2001    Years since quitting: 19.4  . Smokeless tobacco: Never Used  Substance Use Topics  . Alcohol use: Yes    Alcohol/week: 3.0 - 4.0 standard drinks    Types: 3 - 4  Standard drinks or equivalent per week    Comment: both beer and liquor  . Drug use: Never    Comment: weed. extacy.    Review of Systems  Constitutional: Negative for chills and fever.  Respiratory: Negative for cough.   Cardiovascular: Negative for chest pain and palpitations.  Gastrointestinal: Negative for nausea and vomiting.  Psychiatric/Behavioral: Positive for sleep disturbance. Negative for suicidal ideas. The patient is not nervous/anxious.       Objective:    BP 108/74   Pulse 77   Temp (!) 97.4 F (36.3 C) (Oral)   Resp 16   Wt 186 lb (84.4 kg)   SpO2 99%   BMI 25.23 kg/m  BP Readings from Last 3 Encounters:  02/17/21 108/74  11/02/20 122/82  07/20/20 (!) 134/90   Wt Readings from Last 3 Encounters:  02/17/21 186 lb (84.4 kg)  11/02/20 177 lb 3.2 oz (80.4 kg)  07/20/20 180 lb (81.6 kg)    Physical Exam Vitals reviewed.  Constitutional:      Appearance: He is well-developed and well-nourished.  Cardiovascular:     Rate and Rhythm: Regular rhythm.     Heart sounds: Normal heart sounds.  Pulmonary:  Effort: Pulmonary effort is normal. No respiratory distress.     Breath sounds: Normal breath sounds. No wheezing, rhonchi or rales.  Skin:    General: Skin is warm and dry.  Neurological:     Mental Status: He is alert.  Psychiatric:        Mood and Affect: Mood and affect normal.        Speech: Speech normal.        Behavior: Behavior normal.        Assessment & Plan:   Problem List Items Addressed This Visit      Respiratory   OSA (obstructive sleep apnea)    Compliant with cipap. Fatigue improved        Other   Adult attention deficit disorder - Primary    Well controlled. Continue ritalin 10mg . I looked up patient on [place] Controlled Substances Reporting System PMP AWARE and saw no activity that raised concern of inappropriate use.  Refilled medication for 3 months.      Relevant Medications   methylphenidate (RITALIN) 10 MG tablet    methylphenidate (RITALIN) 10 MG tablet   methylphenidate (RITALIN) 10 MG tablet   Seasonal allergies    Well controlled with prn flonase, claritin        He declines screening labs today.   I am having Tyler Hobbs maintain his Multivitamin Gummies Adults, fluticasone, loratadine, methylphenidate, methylphenidate, and methylphenidate.   Meds ordered this encounter  Medications  . methylphenidate (RITALIN) 10 MG tablet    Sig: 1.5 tablets in the morning, 1.5 tablets at noon and 1 tablet in the evening.    Dispense:  124 tablet    Refill:  0    Fill on or after 05/09/21; please include 31 days ( 124 tablets) for months that have 31 days    Order Specific Question:   Supervising Provider    Answer:   05/11/21 L [[id number]]  . methylphenidate (RITALIN) 10 MG tablet    Sig: 1.5 tablets in the am, 1.5 tablets at noon, and 1 tablet in the evening.    Dispense:  124 tablet    Refill:  0    Fill on or after 04/09/21; please include 31 days ( 124 tablets) for months that have 31 days    Order Specific Question:   Supervising Provider    Answer:   04/11/21 L [[id number]]  . methylphenidate (RITALIN) 10 MG tablet    Sig: 1.5 tablets in the morning, 1.5 tablets at noon and 1 tablet in the evening.    Dispense:  120 tablet    Refill:  0    DNF 03/09/21    Order Specific Question:   Supervising Provider    Answer:   03/11/21 [[id number]]    Return precautions given.   Risks, benefits, and alternatives of the medications and treatment plan prescribed today were discussed, and patient expressed understanding.   Education regarding symptom management and diagnosis given to patient on AVS.  Continue to follow with Tyler Shams, FNP for routine health maintenance.   Tyler Hobbs and I agreed with plan.   [name], FNP

## 2021-02-17 NOTE — Assessment & Plan Note (Signed)
Well controlled with prn flonase, claritin

## 2021-03-29 ENCOUNTER — Other Ambulatory Visit: Payer: Self-pay

## 2021-03-29 ENCOUNTER — Ambulatory Visit: Payer: BC Managed Care – PPO

## 2021-03-29 ENCOUNTER — Ambulatory Visit: Payer: BC Managed Care – PPO | Admitting: Medical

## 2021-03-29 ENCOUNTER — Encounter: Payer: Self-pay | Admitting: Medical

## 2021-03-29 VITALS — BP 120/82 | HR 76 | Temp 98.1°F | Resp 16

## 2021-03-29 DIAGNOSIS — H6993 Unspecified Eustachian tube disorder, bilateral: Secondary | ICD-10-CM

## 2021-03-29 DIAGNOSIS — Z20822 Contact with and (suspected) exposure to covid-19: Secondary | ICD-10-CM

## 2021-03-29 DIAGNOSIS — H6501 Acute serous otitis media, right ear: Secondary | ICD-10-CM

## 2021-03-29 DIAGNOSIS — J302 Other seasonal allergic rhinitis: Secondary | ICD-10-CM

## 2021-03-29 DIAGNOSIS — R0981 Nasal congestion: Secondary | ICD-10-CM

## 2021-03-29 LAB — POC COVID19 BINAXNOW: SARS Coronavirus 2 Ag: NEGATIVE

## 2021-03-29 MED ORDER — PREDNISONE 10 MG (21) PO TBPK: ORAL_TABLET | ORAL | 0 refills | Status: DC

## 2021-03-29 MED ORDER — AMOXICILLIN-POT CLAVULANATE 875-125 MG PO TABS: 1.0000 | ORAL_TABLET | Freq: Two times a day (BID) | ORAL | 0 refills | Status: DC

## 2021-03-29 NOTE — Patient Instructions (Signed)
Eustachian Tube Dysfunction  Eustachian tube dysfunction refers to a condition in which a blockage develops in the narrow passage that connects the middle ear to the back of the nose (eustachian tube). The eustachian tube regulates air pressure in the middle ear by letting air move between the ear and nose. It also helps to drain fluid from the middle ear space. Eustachian tube dysfunction can affect one or both ears. When the eustachian tube does not function properly, air pressure, fluid, or both can build up in the middle ear. What are the causes? This condition occurs when the eustachian tube becomes blocked or cannot open normally. Common causes of this condition include:  Ear infections.  Colds and other infections that affect the nose, mouth, and throat (upper respiratory tract).  Allergies.  Irritation from cigarette smoke.  Irritation from stomach acid coming up into the esophagus (gastroesophageal reflux). The esophagus is the tube that carries food from the mouth to the stomach.  Sudden changes in air pressure, such as from descending in an airplane or scuba diving.  Abnormal growths in the nose or throat, such as: ? Growths that line the nose (nasal polyps). ? Abnormal growth of cells (tumors). ? Enlarged tissue at the back of the throat (adenoids). What increases the risk? You are more likely to develop this condition if:  You smoke.  You are overweight.  You are a child who has: ? Certain birth defects of the mouth, such as cleft palate. ? Large tonsils or adenoids. What are the signs or symptoms? Common symptoms of this condition include:  A feeling of fullness in the ear.  Ear pain.  Clicking or popping noises in the ear.  Ringing in the ear.  Hearing loss.  Loss of balance.  Dizziness. Symptoms may get worse when the air pressure around you changes, such as when you travel to an area of high elevation, fly on an airplane, or go scuba diving. How is  this diagnosed? This condition may be diagnosed based on:  Your symptoms.  A physical exam of your ears, nose, and throat.  Tests, such as those that measure: ? The movement of your eardrum (tympanogram). ? Your hearing (audiometry). How is this treated? Treatment depends on the cause and severity of your condition.  In mild cases, you may relieve your symptoms by moving air into your ears. This is called "popping the ears."  In more severe cases, or if you have symptoms of fluid in your ears, treatment may include: ? Medicines to relieve congestion (decongestants). ? Medicines that treat allergies (antihistamines). ? Nasal sprays or ear drops that contain medicines that reduce swelling (steroids). ? A procedure to drain the fluid in your eardrum (myringotomy). In this procedure, a small tube is placed in the eardrum to:  Drain the fluid.  Restore the air in the middle ear space. ? A procedure to insert a balloon device through the nose to inflate the opening of the eustachian tube (balloon dilation). Follow these instructions at home: Lifestyle  Do not do any of the following until your health care provider approves: ? Travel to high altitudes. ? Fly in airplanes. ? Work in a pressurized cabin or room. ? Scuba dive.  Do not use any products that contain nicotine or tobacco, such as cigarettes and e-cigarettes. If you need help quitting, ask your health care provider.  Keep your ears dry. Wear fitted earplugs during showering and bathing. Dry your ears completely after. General instructions  Take over-the-counter   and prescription medicines only as told by your health care provider.  Use techniques to help pop your ears as recommended by your health care provider. These may include: ? Chewing gum. ? Yawning. ? Frequent, forceful swallowing. ? Closing your mouth, holding your nose closed, and gently blowing as if you are trying to blow air out of your nose.  Keep all  follow-up visits as told by your health care provider. This is important. Contact a health care provider if:  Your symptoms do not go away after treatment.  Your symptoms come back after treatment.  You are unable to pop your ears.  You have: ? A fever. ? Pain in your ear. ? Pain in your head or neck. ? Fluid draining from your ear.  Your hearing suddenly changes.  You become very dizzy.  You lose your balance. Summary  Eustachian tube dysfunction refers to a condition in which a blockage develops in the eustachian tube.  It can be caused by ear infections, allergies, inhaled irritants, or abnormal growths in the nose or throat.  Symptoms include ear pain, hearing loss, or ringing in the ears.  Mild cases are treated with maneuvers to unblock the ears, such as yawning or ear popping.  Severe cases are treated with medicines. Surgery may also be done (rare). This information is not intended to replace advice given to you by your health care provider. Make sure you discuss any questions you have with your health care provider. Document Revised: 04/03/2018 Document Reviewed: 04/03/2018 Elsevier Patient Education  2021 Else[employer] Inc. Otitis Media, Adult  Otitis media is a condition in which the middle ear is red and swollen (inflamed) and full of fluid. The middle ear is the part of the ear that contains bones for hearing as well as air that helps send sounds to the brain. The condition usually goes away on its own. What are the causes? This condition is caused by a blockage in the eustachian tube. The eustachian tube connects the middle ear to the back of the nose. It normally allows air into the middle ear. The blockage is caused by fluid or swelling. Problems that can cause blockage include:  A cold or infection that affects the nose, mouth, or throat.  Allergies.  An irritant, such as tobacco smoke.  Adenoids that have become large. The adenoids are soft tissue located  in the back of the throat, behind the nose and the roof of the mouth.  Growth or swelling in the upper part of the throat, just behind the nose (nasopharynx).  Damage to the ear caused by change in pressure. This is called barotrauma. What are the signs or symptoms? Symptoms of this condition include:  Ear pain.  Fever.  Problems with hearing.  Being tired.  Fluid leaking from the ear.  Ringing in the ear. How is this treated? This condition can go away on its own within 3-5 days. But if the condition is caused by bacteria or does not go away on its own, or if it keeps coming back, your doctor may:  Give you antibiotic medicines.  Give you medicines for pain. Follow these instructions at home:  Take over-the-counter and prescription medicines only as told by your doctor.  If you were prescribed an antibiotic medicine, take it as told by your doctor. Do not stop taking the antibiotic even if you start to feel better.  Keep all follow-up visits as told by your doctor. This is important. Contact a doctor if:  You have bleeding from your nose.  There is a lump on your neck.  You are not feeling better in 5 days.  You feel worse instead of better. Get help right away if:  You have pain that is not helped with medicine.  You have swelling, redness, or pain around your ear.  You get a stiff neck.  You cannot move part of your face (paralysis).  You notice that the bone behind your ear hurts when you touch it.  You get a very bad headache. Summary  Otitis media means that the middle ear is red, swollen, and full of fluid.  This condition usually goes away on its own.  If the problem does not go away, treatment may be needed. You may be given medicines to treat the infection or to treat your pain.  If you were prescribed an antibiotic medicine, take it as told by your doctor. Do not stop taking the antibiotic even if you start to feel better.  Keep all follow-up  visits as told by your doctor. This is important. This information is not intended to replace advice given to you by your health care provider. Make sure you discuss any questions you have with your health care provider. Document Revised: 11/14/2019 Document Reviewed: 11/14/2019 Elsevier Patient Education  2021 Elsevier Inc. Allergic Rhinitis, Adult Allergic rhinitis is a reaction to allergens. Allergens are things that can cause an allergic reaction. This condition affects the lining inside the nose (mucous membrane). There are two types of allergic rhinitis:  Seasonal. This type is also called hay fever. It happens only during some times of the year.  Perennial. This type can happen at any time of the year. This condition cannot be spread from person to person (is not contagious). It can be mild, worse, or very bad. It can develop at any age and may be outgrown. What are the causes? This condition may be caused by:  Pollen from grasses, trees, and weeds.  Dust mites.  Smoke.  Mold.  Car fumes.  The pee (urine), spit, or dander of pets. Dander is dead skin cells from a pet.   What increases the risk? You are more likely to develop this condition if:  You have allergies in your family.  You have problems like allergies in your family. You may have: ? Swelling of parts of your eyes and eyelids. ? Asthma. This affects how you breathe. ? Long-term redness and swelling on your skin. ? Food allergies. What are the signs or symptoms? The main symptom of this condition is a runny or stuffy nose (nasal congestion). Other symptoms may include:  Sneezing or coughing.  Itching and tearing of your eyes.  Mucus that drips down the back of your throat (postnasal drip).  Trouble sleeping.  Feeling tired.  Headache.  Sore throat. How is this treated? There is no cure for this condition. You should avoid things that you are allergic to. Treatment can help to relieve symptoms.  This may include:  Medicines that block allergy symptoms, such as corticosteroids or antihistamines. These may be given as a shot, nasal spray, or pill.  Avoiding things you are allergic to.  Medicines that give you bits of what you are allergic to over time. This is called immunotherapy. It is done if other treatments do not help. You may get: ? Shots. ? Medicine under your tongue.  Stronger medicines, if other treatments do not help. Follow these instructions at home: Avoiding allergens Find out what  things you are allergic to and avoid them. To do this, try these things:  If you get allergies any time of year: ? Replace carpet with wood, tile, or vinyl flooring. Carpet can trap pet dander and dust. ? Do not smoke. Do not allow smoking in your home. ? Change your heating and air conditioning filters at least once a month.  If you get allergies only some times of the year: ? Keep windows closed when you can. ? Plan things to do outside when pollen counts are lowest. Check pollen counts before you plan things to do outside. ? When you come indoors, change your clothes and shower before you sit on furniture or bedding.   If you are allergic to a pet: ? Keep the pet out of your bedroom. ? Vacuum, sweep, and dust often.   General instructions  Take over-the-counter and prescription medicines only as told by your doctor.  Drink enough fluid to keep your pee (urine) pale yellow.  Keep all follow-up visits as told by your doctor. This is important. Where to find more information  American Academy of Allergy, Asthma & Immunology: www.aaaai.org Contact a doctor if:  You have a fever.  You get a cough that does not go away.  You make whistling sounds when you breathe (wheeze).  Your symptoms slow you down.  Your symptoms stop you from doing your normal things each day. Get help right away if:  You are short of breath. This symptom may be an emergency. Do not wait to see if  the symptom will go away. Get medical help right away. Call your local emergency services (911 in the [place] Do not drive yourself to the hospital. Summary  Allergic rhinitis may be treated by taking medicines and avoiding things you are allergic to.  If you have allergies only some of the year, keep windows closed when you can at those times.  Contact your doctor if you get a fever or a cough that does not go away. This information is not intended to replace advice given to you by your health care provider. Make sure you discuss any questions you have with your health care provider. Document Revised: 02/03/2020 Document Reviewed: 12/10/2019 Elsevier Patient Education  2021 [employer].

## 2021-03-29 NOTE — Progress Notes (Signed)
   Subjective:    Patient ID: Tyler Hobbs, male    DOB: 08-24-1981, 40 y.o.   MRN: [id number]  HPI  40 yo male in non acute distress complains of left ear pain starting on Friday 03/26/2021.  He states he usually has pain on the opposite side he has infection. He has a history of seasonal alllergies, and used to be on allegy shots.  Daily he takes  Claritin  And Flonase NS. Does rotation every time he uses up a bottle of the antihistamine to another antihistamine.  Outside more this weekend with his daughter.  Blood pressure 120/82, pulse 76, temperature 98.1 F (36.7 C), temperature source Oral, resp. rate 16, SpO2 98 %.  No Known Allergies   Pfizer vaccinated and boosted.    Review of Systems  Constitutional: Negative for chills and fever.  HENT: Positive for congestion, ear pain, postnasal drip, rhinorrhea, sinus pressure and sinus pain (cheek area). Negative for ear discharge.   Respiratory: Negative for cough and shortness of breath.   Cardiovascular: Negative for chest pain.  Allergic/Immunologic: Positive for environmental allergies.       Objective:   Physical Exam Vitals and nursing note reviewed.  Constitutional:      Appearance: Normal appearance.  HENT:     Head: Normocephalic and atraumatic.     Right Ear: Hearing, ear canal and external ear normal. A middle ear effusion is present. Tympanic membrane is erythematous.     Left Ear: Hearing, ear canal and external ear normal. A middle ear effusion is present.     Nose: Mucosal edema and congestion present.     Mouth/Throat:     Mouth: Mucous membranes are moist.     Pharynx: Oropharynx is clear.  Eyes:     Extraocular Movements: Extraocular movements intact.     Conjunctiva/sclera: Conjunctivae normal.     Pupils: Pupils are equal, round, and reactive to light.  Cardiovascular:     Rate and Rhythm: Normal rate and regular rhythm.     Heart sounds: Normal heart sounds.  Pulmonary:     Effort: Pulmonary  effort is normal.     Breath sounds: Normal breath sounds.  Musculoskeletal:        General: Normal range of motion.     Cervical back: Normal range of motion and neck supple.  Skin:    General: Skin is warm and dry.  Neurological:     General: No focal deficit present.     Mental Status: He is alert and oriented to person, place, and time. Mental status is at baseline.  Psychiatric:        Mood and Affect: Mood normal.        Behavior: Behavior normal.        Thought Content: Thought content normal.        Judgment: Judgment normal.           Assessment & Plan:  Otitis media Eustachian dysfunction bilaterally Nasal congestion bilaterally Recommended he stay on his antihistamine and Flonase NS, suggested he rinse his nose with Saline wash to decrease exposure of the nasal mucosa. Reviewed with patient that if he has green discharge from the nares or coughing up green phlegm to call the office and I will prescribe him an antibiotic. He verbalizes understanding and has no questions at discharge.

## 2021-05-19 ENCOUNTER — Other Ambulatory Visit: Payer: Self-pay

## 2021-05-19 ENCOUNTER — Encounter: Payer: Self-pay | Admitting: Family

## 2021-05-19 ENCOUNTER — Ambulatory Visit: Payer: BC Managed Care – PPO | Admitting: Family

## 2021-05-19 DIAGNOSIS — F988 Other specified behavioral and emotional disorders with onset usually occurring in childhood and adolescence: Secondary | ICD-10-CM | POA: Diagnosis not present

## 2021-05-19 NOTE — Progress Notes (Signed)
Subjective:    Patient ID: Tyler Hobbs, male    DOB: 01-13-1981, 40 y.o.   MRN: [id number]  CC: Tyler Hobbs is a 40 y.o. male who presents today for medication follow up.   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 He is planning to take summer off from [facility].  ADHD- compliant with ritalin and feels dosing is adequate. No increased anxiety. No depression.     HISTORY:  Past Medical History:  Diagnosis Date  . Asthma   . Chicken pox   . Hay fever   . Multiple allergies   . OSA (obstructive sleep apnea)    Past Surgical History:  Procedure Laterality Date  . ADENOIDECTOMY    . TYMPANOSTOMY TUBE PLACEMENT     Family History  Problem Relation Age of Onset  . Hypertension Father   . Cancer Maternal Aunt        lung  . Cancer Paternal Aunt        breast  . Alcohol abuse Paternal Uncle   . Arthritis Maternal Grandfather   . Colon cancer Neg Hx   . Prostate cancer Neg Hx     Allergies: Patient has no known allergies. Current Outpatient Medications on File Prior to Visit  Medication Sig Dispense Refill  . fluticasone (FLONASE) 50 MCG/ACT nasal spray Place 2 sprays into both nostrils daily. 16 g 6  . loratadine (CLARITIN) 10 MG tablet Take 10 mg by mouth daily.    . methylphenidate (RITALIN) 10 MG tablet 1.5 tablets in the morning, 1.5 tablets at noon and 1 tablet in the evening. 124 tablet 0  . methylphenidate (RITALIN) 10 MG tablet 1.5 tablets in the am, 1.5 tablets at noon, and 1 tablet in the evening. 124 tablet 0  . methylphenidate (RITALIN) 10 MG tablet 1.5 tablets in the morning, 1.5 tablets at noon and 1 tablet in the evening. 120 tablet 0  . Multiple Vitamins-Minerals (MULTIVITAMIN GUMMIES ADULTS) CHEW Chew by mouth 2 (two) times daily.      No current facility-administered medications on file prior to visit.    Social History   Tobacco Use  . Smoking status: Former Smoker    Quit date: 09/01/2001    Years since  quitting: 19.7  . Smokeless tobacco: Never Used  Substance Use Topics  . Alcohol use: Yes    Alcohol/week: 3.0 - 4.0 standard drinks    Types: 3 - 4 Standard drinks or equivalent per week    Comment: both beer and liquor  . Drug use: Never    Comment: weed. extacy.    Review of Systems  Constitutional: Negative for chills and fever.  Respiratory: Negative for cough.   Cardiovascular: Negative for chest pain and palpitations.  Gastrointestinal: Negative for nausea and vomiting.      Objective:    BP 108/78 (BP Location: Left Arm, Patient Position: Sitting, Cuff Size: Large)   Pulse 77   Temp 97.9 F (36.6 C) (Oral)   Ht 6' (1.829 m)   Wt 189 lb 6.4 oz (85.9 kg)   SpO2 99%   BMI 25.69 kg/m  BP Readings from Last 3 Encounters:  05/19/21 108/78  03/29/21 120/82  02/17/21 108/74   Wt Readings from Last 3 Encounters:  05/19/21 189 lb 6.4 oz (85.9 kg)  02/17/21 186 lb (84.4 kg)  11/02/20 177 lb 3.2 oz (80.4 kg)    Physical Exam Vitals reviewed.  Constitutional:  Appearance: He is well-developed.  Cardiovascular:     Rate and Rhythm: Regular rhythm.     Heart sounds: Normal heart sounds.  Pulmonary:     Effort: Pulmonary effort is normal. No respiratory distress.     Breath sounds: Normal breath sounds. No wheezing, rhonchi or rales.  Skin:    General: Skin is warm and dry.  Neurological:     Mental Status: He is alert.  Psychiatric:        Speech: Speech normal.        Behavior: Behavior normal.        Assessment & Plan:   Problem List Items Addressed This Visit      Other   Adult attention deficit disorder    Controlled. Continue ritalin 10mg  with 15mg  qam, 15 tablet at lunch time and 10mg  in the evening.         Note: he would like to labs at follow up appointment  I have discontinued Hepworth's amoxicillin-clavulanate and predniSONE. I am also having him maintain his Multivitamin Gummies Adults, fluticasone, loratadine,  methylphenidate, methylphenidate, and methylphenidate.   No orders of the defined types were placed in this encounter.   Return precautions given.   Risks, benefits, and alternatives of the medications and treatment plan prescribed today were discussed, and patient expressed understanding.   Education regarding symptom management and diagnosis given to patient on AVS.  Continue to follow with , FNP for routine health maintenance.   and I agreed with plan.   Luisa Hart, FNP

## 2021-05-19 NOTE — Assessment & Plan Note (Signed)
Controlled. Continue ritalin 10mg  with 15mg  qam, 15 tablet at lunch time and 10mg  in the evening.

## 2021-06-01 ENCOUNTER — Other Ambulatory Visit: Payer: Self-pay | Admitting: Family

## 2021-06-01 DIAGNOSIS — F988 Other specified behavioral and emotional disorders with onset usually occurring in childhood and adolescence: Secondary | ICD-10-CM

## 2021-06-01 MED ORDER — METHYLPHENIDATE HCL 10 MG PO TABS: ORAL_TABLET | ORAL | 0 refills | Status: DC

## 2021-07-15 ENCOUNTER — Encounter: Payer: Self-pay | Admitting: Family

## 2021-07-16 ENCOUNTER — Other Ambulatory Visit: Payer: Self-pay | Admitting: Family

## 2021-07-16 DIAGNOSIS — Z9229 Personal history of other drug therapy: Secondary | ICD-10-CM

## 2021-07-22 ENCOUNTER — Ambulatory Visit: Payer: BC Managed Care – PPO

## 2021-07-22 ENCOUNTER — Other Ambulatory Visit: Payer: Self-pay

## 2021-07-22 ENCOUNTER — Other Ambulatory Visit: Payer: BC Managed Care – PPO

## 2021-07-22 DIAGNOSIS — Z9229 Personal history of other drug therapy: Secondary | ICD-10-CM

## 2021-07-22 DIAGNOSIS — Z23 Encounter for immunization: Secondary | ICD-10-CM

## 2021-07-22 NOTE — Progress Notes (Signed)
Lab work

## 2021-07-22 NOTE — Progress Notes (Signed)
Lab work and immunization.

## 2021-07-24 ENCOUNTER — Encounter: Payer: Self-pay | Admitting: Nurse Practitioner

## 2021-07-24 LAB — MEASLES/MUMPS/RUBELLA IMMUNITY
MUMPS ABS, IGG: 11.7 AU/mL (ref >10.9)
RUBEOLA AB, IGG: 108 AU/mL (ref >16.4)
Rubella Antibodies, IGG: 3.67 index (ref >0.99)

## 2021-08-19 ENCOUNTER — Other Ambulatory Visit: Payer: Self-pay

## 2021-08-19 ENCOUNTER — Ambulatory Visit: Payer: BC Managed Care – PPO

## 2021-08-19 DIAGNOSIS — Z23 Encounter for immunization: Secondary | ICD-10-CM

## 2021-08-19 MED ORDER — TETANUS-DIPHTHERIA TOXOIDS TD 5-2 LFU IM INJ: 0.5000 mL | INJECTION | Freq: Once | INTRAMUSCULAR | Status: DC

## 2021-09-15 ENCOUNTER — Other Ambulatory Visit: Payer: Self-pay

## 2021-09-15 ENCOUNTER — Ambulatory Visit: Payer: BC Managed Care – PPO

## 2021-09-15 DIAGNOSIS — Z23 Encounter for immunization: Secondary | ICD-10-CM

## 2021-09-21 ENCOUNTER — Other Ambulatory Visit: Payer: Self-pay | Admitting: Family

## 2021-09-21 DIAGNOSIS — F988 Other specified behavioral and emotional disorders with onset usually occurring in childhood and adolescence: Secondary | ICD-10-CM

## 2021-09-21 NOTE — Telephone Encounter (Signed)
RX Refill:ritalin Last Seen:05-19-21 Last ordered:06-01-21

## 2021-09-23 MED ORDER — METHYLPHENIDATE HCL 10 MG PO TABS: ORAL_TABLET | ORAL | 0 refills | Status: DC

## 2021-09-23 NOTE — Telephone Encounter (Signed)
Detailed message left on VM informing patient that he needs to schedule an appointment every three months to receive his rx refill. No more refills until he is evaluated.

## 2021-10-25 ENCOUNTER — Encounter: Payer: Self-pay | Admitting: Medical

## 2021-10-25 ENCOUNTER — Other Ambulatory Visit: Payer: Self-pay

## 2021-10-25 ENCOUNTER — Ambulatory Visit: Payer: BC Managed Care – PPO | Admitting: Medical

## 2021-10-25 VITALS — BP 122/80 | HR 74 | Temp 98.2°F | Resp 16

## 2021-10-25 DIAGNOSIS — R062 Wheezing: Secondary | ICD-10-CM

## 2021-10-25 DIAGNOSIS — J0100 Acute maxillary sinusitis, unspecified: Secondary | ICD-10-CM

## 2021-10-25 MED ORDER — ALBUTEROL SULFATE HFA 108 (90 BASE) MCG/ACT IN AERS: 2.0000 | INHALATION_SPRAY | Freq: Four times a day (QID) | RESPIRATORY_TRACT | 2 refills | Status: DC | PRN

## 2021-10-25 MED ORDER — AMOXICILLIN-POT CLAVULANATE 875-125 MG PO TABS: 1.0000 | ORAL_TABLET | Freq: Two times a day (BID) | ORAL | 0 refills | Status: DC

## 2021-10-25 NOTE — Patient Instructions (Signed)

## 2021-10-25 NOTE — Progress Notes (Signed)
Subjective:    Patient ID: Tyler Hobbs, male    DOB: 1981-06-29, 40 y.o.   MRN: [id number]  HPI 40 yo male in non acute distress presents with c/o  sinus pressure and congestion, yellow discharge,  starting early last week. Worsening Saturday ( outside all day at the football game). Woke up  Sunday with cough and ST. Felt he was wheezing last night. Covid home test yesterday and today both negative. Taking Mucinex, symptoms worse yesterday.   History of seasonal allergies,  every 30 d rotates zyretec, claritin and allegra.currently on Zyrtec.   Blood pressure 122/80, pulse 74, temperature 98.2 F (36.8 C), temperature source Tympanic, resp. rate 16, SpO2 98 %.   told he has seasonal asthma.  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 So he "under a lot of stress".  Review of Systems  Constitutional:  Negative for chills and fever.  HENT:  Positive for congestion, postnasal drip, rhinorrhea (yellow), sinus pressure, sinus pain (maxillary) and sore throat (this am). Negative for ear pain and sneezing.   Respiratory:  Positive for cough (last night) and wheezing (last night).       Objective:   Physical Exam Constitutional:      Appearance: Normal appearance. He is normal weight.  HENT:     Head: Normocephalic and atraumatic.     Right Ear: Ear canal and external ear normal.     Left Ear: Ear canal and external ear normal.     Nose: Congestion and rhinorrhea present.     Mouth/Throat:     Mouth: Mucous membranes are moist.     Pharynx: Oropharynx is clear.  Eyes:     Extraocular Movements: Extraocular movements intact.     Conjunctiva/sclera: Conjunctivae normal.     Pupils: Pupils are equal, round, and reactive to light.  Cardiovascular:     Rate and Rhythm: Normal rate and regular rhythm.     Heart sounds: No murmur heard.   No friction rub. No gallop.  Pulmonary:     Effort: Pulmonary effort is normal.     Breath sounds:  Normal breath sounds. No wheezing or rales.     Comments: Cough noted in room Musculoskeletal:        General: Normal range of motion.     Cervical back: Normal range of motion and neck supple.  Skin:    General: Skin is warm and dry.  Neurological:     General: No focal deficit present.     Mental Status: He is alert and oriented to person, place, and time.  Psychiatric:        Mood and Affect: Mood normal.        Behavior: Behavior normal.        Thought Content: Thought content normal.          Assessment & Plan:  Sinusitis Maxillalry non recurrent H/O Wheezing He would like an albuterol MDI, he has previously had to be on one when he was ill. Rest, Increase fluids. Meds ordered this encounter  Medications   amoxicillin-clavulanate (AUGMENTIN) 875-125 MG tablet    Sig: Take 1 tablet by mouth 2 (two) times daily.    Dispense:  20 tablet    Refill:  0   albuterol (VENTOLIN HFA) 108 (90 Base) MCG/ACT inhaler    Sig: Inhale 2 puffs into the lungs every 6 (six) hours as needed for wheezing or shortness of breath.    Dispense:  8 g    Refill:  2   Take care of yourself, return to the clinic if not improving in 3-5 days, sooner if any concerns, keep appointment with PCP on Friday. Patient verbalizes understanding and has no questions at discharge.

## 2021-10-29 ENCOUNTER — Encounter: Payer: Self-pay | Admitting: Family

## 2021-10-29 ENCOUNTER — Other Ambulatory Visit: Payer: Self-pay | Admitting: Family

## 2021-10-29 ENCOUNTER — Other Ambulatory Visit: Payer: Self-pay

## 2021-10-29 ENCOUNTER — Ambulatory Visit: Payer: BC Managed Care – PPO | Admitting: Family

## 2021-10-29 DIAGNOSIS — F988 Other specified behavioral and emotional disorders with onset usually occurring in childhood and adolescence: Secondary | ICD-10-CM

## 2021-10-29 MED ORDER — METHYLPHENIDATE HCL 10 MG PO TABS: ORAL_TABLET | ORAL | 0 refills | Status: DC

## 2021-10-29 NOTE — Assessment & Plan Note (Signed)
Chronic, stable.  Continue Ritalin as prescribed.  I have refilled today. I looked up patient on [place] Controlled Substances Reporting System PMP AWARE and saw no activity that raised concern of inappropriate use.

## 2021-10-29 NOTE — Progress Notes (Signed)
Subjective:    Patient ID: Tyler Hobbs, male    DOB: 04-Nov-1981, 40 y.o.   MRN: [id number]  CC: Giavanni Odonovan is a 40 y.o. male who presents today for follow up.   HPI: Feels well today.  No new complaints.  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  He continues to see a counselor through  [facility] approximately once a month which has been helpful.  Divorce will soon be final   ADHD - compliant with ritalin 10mg  with 15mg  qam, 15 tablet at lunch time and 10mg  in the evening.  Feels regimen is working well for him.  Denies increased anxiety, trouble sleeping or heart palpitations.  He requests a refill  Due for annual labs.     HISTORY:  Past Medical History:  Diagnosis Date   Asthma    Chicken pox    Hay fever    Multiple allergies    OSA (obstructive sleep apnea)    Past Surgical History:  Procedure Laterality Date   ADENOIDECTOMY     TYMPANOSTOMY TUBE PLACEMENT     Family History  Problem Relation Age of Onset   Hypertension Father    Cancer Maternal Aunt        lung   Cancer Paternal Aunt        breast   Alcohol abuse Paternal Uncle    Arthritis Maternal Grandfather    Colon cancer Neg Hx    Prostate cancer Neg Hx     Allergies: Patient has no known allergies. Current Outpatient Medications on File Prior to Visit  Medication Sig Dispense Refill   albuterol (VENTOLIN HFA) 108 (90 Base) MCG/ACT inhaler Inhale 2 puffs into the lungs every 6 (six) hours as needed for wheezing or shortness of breath. 8 g 2   amoxicillin-clavulanate (AUGMENTIN) 875-125 MG tablet Take 1 tablet by mouth 2 (two) times daily. 20 tablet 0   fluticasone (FLONASE) 50 MCG/ACT nasal spray Place 2 sprays into both nostrils daily. 16 g 6   loratadine (CLARITIN) 10 MG tablet Take 10 mg by mouth daily.     Multiple Vitamins-Minerals (MULTIVITAMIN GUMMIES ADULTS) CHEW Chew by mouth 2 (two) times daily.      No current facility-administered  medications on file prior to visit.    Social History   Tobacco Use   Smoking status: Former    Types: Cigarettes    Quit date: 09/01/2001    Years since quitting: 20.1   Smokeless tobacco: Never  Substance Use Topics   Alcohol use: Yes    Alcohol/week: 3.0 - 4.0 standard drinks    Types: 3 - 4 Standard drinks or equivalent per week    Comment: both beer and liquor   Drug use: Never    Comment: weed. extacy.    Review of Systems  Constitutional:  Negative for chills and fever.  Respiratory:  Negative for cough.   Cardiovascular:  Negative for chest pain and palpitations.  Gastrointestinal:  Negative for nausea and vomiting.     Objective:    BP 131/90 (BP Location: Left Arm, Patient Position: Sitting, Cuff Size: Normal)   Pulse 87   Temp 98.3 F (36.8 C) (Oral)   Ht 6' (1.829 m)   Wt 189 lb (85.7 kg)   SpO2 98%   BMI 25.63 kg/m  BP Readings from Last 3 Encounters:  10/29/21 131/90  10/25/21 122/80  05/19/21 108/78   Wt Readings from Last 3 Encounters:  10/29/21 189 lb (85.7 kg)  05/19/21 189 lb 6.4 oz (85.9 kg)  02/17/21 186 lb (84.4 kg)    Physical Exam Vitals reviewed.  Constitutional:      Appearance: He is well-developed.  Cardiovascular:     Rate and Rhythm: Regular rhythm.     Heart sounds: Normal heart sounds.  Pulmonary:     Effort: Pulmonary effort is normal. No respiratory distress.     Breath sounds: Normal breath sounds. No wheezing, rhonchi or rales.  Skin:    General: Skin is warm and dry.  Neurological:     Mental Status: He is alert.  Psychiatric:        Speech: Speech normal.        Behavior: Behavior normal.       Assessment & Plan:   Problem List Items Addressed This Visit       Other   Adult attention deficit disorder    Chronic, stable.  Continue Ritalin as prescribed.  I have refilled today. I looked up patient on [place] Controlled Substances Reporting System PMP AWARE and saw no activity that raised concern of inappropriate  use.        Relevant Medications   methylphenidate (RITALIN) 10 MG tablet   methylphenidate (RITALIN) 10 MG tablet   methylphenidate (RITALIN) 10 MG tablet     I am having [name] maintain his Multivitamin Gummies Adults, fluticasone, loratadine, amoxicillin-clavulanate, albuterol, methylphenidate, methylphenidate, and methylphenidate.   Meds ordered this encounter  Medications   methylphenidate (RITALIN) 10 MG tablet    Sig: 1.5 tablets in the morning, 1.5 tablets at noon and 1 tablet in the evening.    Dispense:  124 tablet    Refill:  0    DNF 01/08/22 , please include 31 days ( 124 tablets) for months that have 31 days    Order Specific Question:   Supervising Provider    Answer:   Sherlene Shams [[id number]]   methylphenidate (RITALIN) 10 MG tablet    Sig: 1.5 tablets in the am, 1.5 tablets at noon, and 1 tablet in the evening.    Dispense:  124 tablet    Refill:  0    Fill on or after 12/08/21; please include 31 days ( 124 tablets) for months that have 31 days    Order Specific Question:   Supervising Provider    Answer:   Sherlene Shams [[id number]]   methylphenidate (RITALIN) 10 MG tablet    Sig: 1.5 tablets in the morning, 1.5 tablets at noon and 1 tablet in the evening.    Dispense:  120 tablet    Refill:  0    DNF 11/08/21    Order Specific Question:   Supervising Provider    Answer:   Sherlene Shams [[id number]]    Return precautions given.   Risks, benefits, and alternatives of the medications and treatment plan prescribed today were discussed, and patient expressed understanding.   Education regarding symptom management and diagnosis given to patient on AVS.  Continue to follow with Allegra Grana, FNP for routine health maintenance.   [name] and I agreed with plan.   Rennie Plowman, FNP

## 2021-11-01 ENCOUNTER — Other Ambulatory Visit: Payer: Self-pay

## 2021-11-01 ENCOUNTER — Encounter: Payer: Self-pay | Admitting: Family

## 2021-11-04 ENCOUNTER — Other Ambulatory Visit: Payer: BC Managed Care – PPO

## 2021-11-04 ENCOUNTER — Other Ambulatory Visit: Payer: Self-pay

## 2021-11-04 DIAGNOSIS — F988 Other specified behavioral and emotional disorders with onset usually occurring in childhood and adolescence: Secondary | ICD-10-CM

## 2021-11-04 DIAGNOSIS — E559 Vitamin D deficiency, unspecified: Secondary | ICD-10-CM

## 2021-11-05 LAB — CMP12+LP+TP+TSH+6AC+CBC/D/PLT
ALT: 17 IU/L (ref 0–44)
AST: 18 IU/L (ref 0–40)
Albumin/Globulin Ratio: 2.3 — ABNORMAL HIGH (ref 1.2–2.2)
Albumin: 4.6 g/dL (ref 4.0–5.0)
Alkaline Phosphatase: 53 IU/L (ref 44–121)
BUN/Creatinine Ratio: 18 (ref 9–20)
BUN: 15 mg/dL (ref 6–24)
Basophils Absolute: 0 10*3/uL (ref 0.0–0.2)
Basos: 1 %
Bilirubin Total: 0.9 mg/dL (ref 0.0–1.2)
Calcium: 9.7 mg/dL (ref 8.7–10.2)
Chloride: 100 mmol/L (ref 96–106)
Chol/HDL Ratio: 3.9 ratio (ref 0.0–5.0)
Cholesterol, Total: 161 mg/dL (ref 100–199)
Creatinine, Ser: 0.85 mg/dL (ref 0.76–1.27)
EOS (ABSOLUTE): 0.1 10*3/uL (ref 0.0–0.4)
Eos: 1 %
Estimated CHD Risk: 0.7 times avg. (ref 0.0–1.0)
Free Thyroxine Index: 1.9 (ref 1.2–4.9)
GGT: 17 IU/L (ref 0–65)
Globulin, Total: 2 g/dL (ref 1.5–4.5)
Glucose: 105 mg/dL — ABNORMAL HIGH (ref 70–99)
HDL: 41 mg/dL (ref >39)
Hematocrit: 49.1 % (ref 37.5–51.0)
Hemoglobin: 16.7 g/dL (ref 13.0–17.7)
Immature Grans (Abs): 0 10*3/uL (ref 0.0–0.1)
Immature Granulocytes: 0 %
Iron: 182 ug/dL — ABNORMAL HIGH (ref 38–169)
LDH: 117 IU/L — ABNORMAL LOW (ref 121–224)
LDL Chol Calc (NIH): 106 mg/dL — ABNORMAL HIGH (ref 0–99)
Lymphocytes Absolute: 1.6 10*3/uL (ref 0.7–3.1)
Lymphs: 34 %
MCH: 32.1 pg (ref 26.6–33.0)
MCHC: 34 g/dL (ref 31.5–35.7)
MCV: 94 fL (ref 79–97)
Monocytes Absolute: 0.4 10*3/uL (ref 0.1–0.9)
Monocytes: 9 %
Neutrophils Absolute: 2.6 10*3/uL (ref 1.4–7.0)
Neutrophils: 55 %
Phosphorus: 3.3 mg/dL (ref 2.8–4.1)
Platelets: 233 10*3/uL (ref 150–450)
Potassium: 4.6 mmol/L (ref 3.5–5.2)
RBC: 5.2 x10E6/uL (ref 4.14–5.80)
RDW: 12 % (ref 11.6–15.4)
Sodium: 137 mmol/L (ref 134–144)
T3 Uptake Ratio: 29 % (ref 24–39)
T4, Total: 6.6 ug/dL (ref 4.5–12.0)
TSH: 1.79 u[IU]/mL (ref 0.450–4.500)
Total Protein: 6.6 g/dL (ref 6.0–8.5)
Triglycerides: 73 mg/dL (ref 0–149)
Uric Acid: 4.8 mg/dL (ref 3.8–8.4)
VLDL Cholesterol Cal: 14 mg/dL (ref 5–40)
WBC: 4.7 10*3/uL (ref 3.4–10.8)
eGFR: 111 mL/min/{1.73_m2} (ref >59)

## 2021-11-05 LAB — HGB A1C W/O EAG: Hgb A1c MFr Bld: 5.1 % (ref 4.8–5.6)

## 2021-11-05 LAB — VITAMIN D 25 HYDROXY (VIT D DEFICIENCY, FRACTURES): Vit D, 25-Hydroxy: 29.2 ng/mL — ABNORMAL LOW (ref 30.0–100.0)

## 2021-11-10 ENCOUNTER — Other Ambulatory Visit: Payer: Self-pay | Admitting: Family

## 2021-11-10 ENCOUNTER — Telehealth: Payer: Self-pay

## 2021-11-10 DIAGNOSIS — R790 Abnormal level of blood mineral: Secondary | ICD-10-CM

## 2021-11-10 NOTE — Telephone Encounter (Signed)
-----   Message from Allegra Grana, FNP sent at 11/10/2021  1:43 PM EST ----- CALL-  Ask pt if he taking iron. He needs to stop.  I have ordered iron store panel. Ask him to have this done 2-3 weeks  Let me know if cannot reach patient.

## 2021-11-26 ENCOUNTER — Other Ambulatory Visit: Payer: BC Managed Care – PPO | Admitting: Nurse Practitioner

## 2021-11-26 ENCOUNTER — Other Ambulatory Visit: Payer: Self-pay

## 2021-11-26 DIAGNOSIS — R790 Abnormal level of blood mineral: Secondary | ICD-10-CM

## 2021-11-29 LAB — SPECIMEN STATUS REPORT

## 2021-12-21 LAB — SPECIMEN STATUS REPORT

## 2021-12-21 LAB — IRON AND TIBC
Iron Saturation: 59 % — ABNORMAL HIGH (ref 15–55)
Iron: 166 ug/dL (ref 38–169)
Total Iron Binding Capacity: 283 ug/dL (ref 250–450)
UIBC: 117 ug/dL (ref 111–343)

## 2021-12-21 LAB — FERRITIN: Ferritin: 434 ng/mL — ABNORMAL HIGH (ref 30–400)

## 2021-12-29 ENCOUNTER — Other Ambulatory Visit: Payer: Self-pay

## 2021-12-29 DIAGNOSIS — R790 Abnormal level of blood mineral: Secondary | ICD-10-CM

## 2021-12-30 ENCOUNTER — Encounter: Payer: Self-pay | Admitting: Family

## 2021-12-30 ENCOUNTER — Other Ambulatory Visit: Payer: Self-pay

## 2021-12-31 ENCOUNTER — Other Ambulatory Visit: Payer: Self-pay

## 2021-12-31 ENCOUNTER — Other Ambulatory Visit: Payer: BC Managed Care – PPO

## 2021-12-31 DIAGNOSIS — R790 Abnormal level of blood mineral: Secondary | ICD-10-CM

## 2022-01-01 LAB — IRON,TIBC AND FERRITIN PANEL
Ferritin: 378 ng/mL (ref 30–400)
Iron Saturation: 49 % (ref 15–55)
Iron: 136 ug/dL (ref 38–169)
Total Iron Binding Capacity: 279 ug/dL (ref 250–450)
UIBC: 143 ug/dL (ref 111–343)

## 2022-01-07 ENCOUNTER — Encounter: Payer: Self-pay | Admitting: Family

## 2022-01-09 ENCOUNTER — Encounter: Payer: Self-pay | Admitting: Family

## 2022-01-13 ENCOUNTER — Other Ambulatory Visit: Payer: Self-pay | Admitting: Family

## 2022-01-13 ENCOUNTER — Telehealth: Payer: Self-pay

## 2022-01-13 DIAGNOSIS — F988 Other specified behavioral and emotional disorders with onset usually occurring in childhood and adolescence: Secondary | ICD-10-CM

## 2022-01-13 MED ORDER — METHYLPHENIDATE HCL 10 MG PO TABS: ORAL_TABLET | ORAL | 0 refills | Status: DC

## 2022-01-13 NOTE — Telephone Encounter (Signed)
PA was done on patient's Ritalin & approved. He was however was only able to get 90 tablets & was 30 short. Are you able to send the script for the 30 since now approved? I have called [facility] & that advised this is what is required.

## 2022-01-17 ENCOUNTER — Encounter: Payer: Self-pay | Admitting: Medical

## 2022-01-17 ENCOUNTER — Ambulatory Visit
Admission: RE | Admit: 2022-01-17 | Discharge: 2022-01-17 | Disposition: A | Discharge status: Home / Self Care | Payer: BC Managed Care – PPO | Source: Ambulatory Visit | Attending: Medical | Admitting: Medical

## 2022-01-17 ENCOUNTER — Other Ambulatory Visit: Payer: Self-pay | Admitting: Medical

## 2022-01-17 ENCOUNTER — Other Ambulatory Visit: Payer: Self-pay

## 2022-01-17 ENCOUNTER — Ambulatory Visit: Payer: BC Managed Care – PPO | Admitting: Medical

## 2022-01-17 VITALS — BP 120/78 | HR 75 | Temp 97.9°F | Resp 16

## 2022-01-17 DIAGNOSIS — R1010 Upper abdominal pain, unspecified: Secondary | ICD-10-CM | POA: Diagnosis not present

## 2022-01-17 DIAGNOSIS — R1011 Right upper quadrant pain: Secondary | ICD-10-CM

## 2022-01-17 LAB — SPECIMEN STATUS REPORT

## 2022-01-17 NOTE — Progress Notes (Signed)
° °  Subjective:    Patient ID: Tyler Hobbs, male    DOB: 1981/06/26, 41 y.o.   MRN: [id number]  HPI 41 yo male with  7 days history of pain after eating. Yesterday had chicken wings an he had 5 hours of abdominal pain. Isolated to upper quadrants but has been all over abdominal pain as well. At toast with a little butter on it.  2020 lost  40lbs in  2 months after separation from wife.   Review of Systems  Gastrointestinal:  Positive for abdominal pain and nausea.  All other systems reviewed and are negative.    Normal BM this am. Objective:   Physical Exam Vitals and nursing note reviewed.  Constitutional:      Appearance: He is well-developed.  HENT:     Head: Normocephalic and atraumatic.     Mouth/Throat:     Mouth: Mucous membranes are moist.     Pharynx: Oropharynx is clear.  Eyes:     Extraocular Movements: Extraocular movements intact.     Pupils: Pupils are equal, round, and reactive to light.  Cardiovascular:     Rate and Rhythm: Normal rate and regular rhythm.     Heart sounds: No murmur heard.   No friction rub. No gallop.  Pulmonary:     Effort: Pulmonary effort is normal.     Breath sounds: Normal breath sounds.  Abdominal:     General: Bowel sounds are normal. There is no distension.     Palpations: Abdomen is soft. There is no hepatomegaly, splenomegaly or mass.     Tenderness: There is abdominal tenderness in the right upper quadrant and epigastric area.  Skin:    General: Skin is warm and dry.  Neurological:     General: No focal deficit present.     Mental Status: He is alert.  Psychiatric:        Mood and Affect: Mood normal.        Behavior: Behavior normal.          Assessment & Plan:  Right Upper Quadrant pain Clear liquid diet only, gatorade and chicken or beef bullion soup. Labs, CBC, Cmet Lipase stat  U /S scheudled for today at 2:15pm. Reviewed gallbladder u/s with patient. He ate toast with a little butter, some discomfort but  not like he did yesterday. Reviewed labs, still pending CBC. I recommended he check his MyChart if he gets a message from MyChart. Patient verbalizes understanding and has no questions at [facility].   Orders Placed This Encounter  Procedures   US Abdomen Limited RUQ (LIVER/GB)    Standing Status:   Future    Number of Occurrences:   1    Standing Expiration Date:   01/17/2023    Order Specific Question:   Reason for Exam (SYMPTOM  OR DIAGNOSIS REQUIRED)    Answer:   RUQ x 7 days    Order Specific Question:   Preferred imaging location?    Answer:   [facility]    Order Specific Question:   Call Results- Best Contact Number?    Answer:   [phone]   CMP12+LP+TP+TSH+6AC+PSA+CBC   VITAMIN D 25 Hydroxy (Vit-D Deficiency, Fractures)   Lipase   Specimen status report

## 2022-01-17 NOTE — Patient Instructions (Signed)
Bland Diet A bland diet consists of foods that are often soft and do not have a lot of fat, fiber, or extra seasonings. Foods without fat, fiber, or seasoning are easier for the body to digest. They are also less likely to irritate your mouth, throat, stomach, and other parts of your digestive system. A bland dietis sometimes called a BRAT diet. What is my plan? Your health care provider or food and nutrition specialist (dietitian) may recommend specific changes to your diet to prevent symptoms or to treat your symptoms. These changes may include: Eating small meals often. Cooking food until it is soft enough to chew easily. Chewing your food well. Drinking fluids slowly. Not eating foods that are very spicy, sour, or fatty. Not eating citrus fruits, such as oranges and grapefruit. What do I need to know about this diet? Eat a variety of foods from the bland diet food list. Do not follow a bland diet longer than needed. Ask your health care provider whether you should take vitamins or supplements. What foods can I eat? Grains  Hot cereals, such as cream of wheat. Rice. Bread, crackers, or tortillas madefrom refined white flour. Vegetables Canned or cooked vegetables. Mashed or boiled potatoes. Fruits  Bananas. Applesauce. Other types of cooked or canned fruit with the skin andseeds removed, such as canned peaches or pears. Meats and other proteins  Scrambled eggs. Creamy peanut butter or other nut butters. Lean, well-cookedmeats, such as chicken or fish. Tofu. Soups or broths. Dairy Low-fat dairy products, such as milk, cottage cheese, or yogurt. Beverages  Water. Herbal tea. Apple juice. Fats and oils Mild salad dressings. Canola or olive oil. Sweets and desserts Pudding. Custard. Fruit gelatin. Ice cream. The items listed above may not be a complete list of recommended foods and beverages. Contact a dietitian for more options. What foods are not recommended? Grains Whole grain  breads and cereals. Vegetables Raw vegetables. Fruits Raw fruits, especially citrus, berries, or dried fruits. Dairy Whole fat dairy foods. Beverages Caffeinated drinks. Alcohol. Seasonings and condiments Strongly flavored seasonings or condiments. Hot sauce. Salsa. Other foods Spicy foods. Fried foods. Sour foods, such as pickled or fermented foods. Foodswith high sugar content. Foods high in fiber. The items listed above may not be a complete list of foods and beverages to avoid. Contact a dietitian for more information. Summary A bland diet consists of foods that are often soft and do not have a lot of fat, fiber, or extra seasonings. Foods without fat, fiber, or seasoning are easier for the body to digest. Check with your health care provider to see how long you should follow this diet plan. It is not meant to be followed for long periods. This information is not intended to replace advice given to you by your health care provider. Make sure you discuss any questions you have with your healthcare provider. Document Revised: 01/10/2018 Document Reviewed: 01/10/2018 Elsevier Patient Education  2022 Elsevier Inc.  

## 2022-01-18 ENCOUNTER — Encounter: Payer: Self-pay | Admitting: Medical

## 2022-01-18 LAB — CMP12+LP+TP+TSH+6AC+PSA+CBC…
ALT: 13 IU/L (ref 0–44)
AST: 17 IU/L (ref 0–40)
Albumin/Globulin Ratio: 2.3 — ABNORMAL HIGH (ref 1.2–2.2)
Albumin: 4.6 g/dL (ref 4.0–5.0)
Alkaline Phosphatase: 63 IU/L (ref 44–121)
BUN/Creatinine Ratio: 23 — ABNORMAL HIGH (ref 9–20)
BUN: 20 mg/dL (ref 6–24)
Basophils Absolute: 0.1 10*3/uL (ref 0.0–0.2)
Basos: 1 %
Bilirubin Total: 0.6 mg/dL (ref 0.0–1.2)
Calcium: 9.6 mg/dL (ref 8.7–10.2)
Chloride: 102 mmol/L (ref 96–106)
Chol/HDL Ratio: 3.5 ratio (ref 0.0–5.0)
Cholesterol, Total: 152 mg/dL (ref 100–199)
Creatinine, Ser: 0.88 mg/dL (ref 0.76–1.27)
EOS (ABSOLUTE): 0.1 10*3/uL (ref 0.0–0.4)
Eos: 1 %
Estimated CHD Risk: 0.6 times avg. (ref 0.0–1.0)
Free Thyroxine Index: 1.7 (ref 1.2–4.9)
GGT: 14 IU/L (ref 0–65)
Globulin, Total: 2 g/dL (ref 1.5–4.5)
Glucose: 89 mg/dL (ref 70–99)
HDL: 43 mg/dL (ref >39)
Hematocrit: 44.6 % (ref 37.5–51.0)
Hemoglobin: 15.6 g/dL (ref 13.0–17.7)
Immature Grans (Abs): 0 10*3/uL (ref 0.0–0.1)
Immature Granulocytes: 0 %
Iron: 203 ug/dL — ABNORMAL HIGH (ref 38–169)
LDH: 113 IU/L — ABNORMAL LOW (ref 121–224)
LDL Chol Calc (NIH): 82 mg/dL (ref 0–99)
Lymphocytes Absolute: 2.7 10*3/uL (ref 0.7–3.1)
Lymphs: 40 %
MCH: 31.7 pg (ref 26.6–33.0)
MCHC: 35 g/dL (ref 31.5–35.7)
MCV: 91 fL (ref 79–97)
Monocytes Absolute: 0.7 10*3/uL (ref 0.1–0.9)
Monocytes: 11 %
Neutrophils Absolute: 3.1 10*3/uL (ref 1.4–7.0)
Neutrophils: 47 %
Phosphorus: 3.4 mg/dL (ref 2.8–4.1)
Platelets: 226 10*3/uL (ref 150–450)
Potassium: 4 mmol/L (ref 3.5–5.2)
Prostate Specific Ag, Serum: 0.8 ng/mL (ref 0.0–4.0)
RBC: 4.92 x10E6/uL (ref 4.14–5.80)
RDW: 12.1 % (ref 11.6–15.4)
Sodium: 139 mmol/L (ref 134–144)
T3 Uptake Ratio: 26 % (ref 24–39)
T4, Total: 6.7 ug/dL (ref 4.5–12.0)
TSH: 2.19 u[IU]/mL (ref 0.450–4.500)
Total Protein: 6.6 g/dL (ref 6.0–8.5)
Triglycerides: 158 mg/dL — ABNORMAL HIGH (ref 0–149)
Uric Acid: 5.2 mg/dL (ref 3.8–8.4)
VLDL Cholesterol Cal: 27 mg/dL (ref 5–40)
WBC: 6.7 10*3/uL (ref 3.4–10.8)
eGFR: 110 mL/min/{1.73_m2} (ref >59)

## 2022-01-18 LAB — VITAMIN D 25 HYDROXY (VIT D DEFICIENCY, FRACTURES): Vit D, 25-Hydroxy: 21.6 ng/mL — ABNORMAL LOW (ref 30.0–100.0)

## 2022-01-18 LAB — LIPASE: Lipase: 25 U/L (ref 13–78)

## 2022-01-18 NOTE — Progress Notes (Signed)
Labs are normal for lipase. And CBC. Bland diet for now. Recommend follow up with his doctor. These are non fasting labs.

## 2022-01-19 ENCOUNTER — Other Ambulatory Visit: Payer: Self-pay

## 2022-01-19 ENCOUNTER — Encounter: Payer: Self-pay | Admitting: Medical

## 2022-01-19 ENCOUNTER — Ambulatory Visit: Payer: BC Managed Care – PPO | Admitting: Medical

## 2022-01-19 VITALS — BP 120/78 | HR 95 | Temp 97.6°F | Resp 16

## 2022-01-19 DIAGNOSIS — F988 Other specified behavioral and emotional disorders with onset usually occurring in childhood and adolescence: Secondary | ICD-10-CM

## 2022-01-19 DIAGNOSIS — R1010 Upper abdominal pain, unspecified: Secondary | ICD-10-CM

## 2022-01-19 NOTE — Progress Notes (Signed)
Subjective:    Patient ID: Tyler Hobbs, male    DOB: Dec 13, 1981, 41 y.o.   MRN: [id number]  HPI 41 yo male in non acute distress here for recheck of abdomen.  Blood pressure 120/78, pulse 95, temperature 97.6 F (36.4 C), temperature source Tympanic, resp. rate 16, SpO2 98 %.   No Known Allergies Past Medical History:  Diagnosis Date   Asthma    Chicken pox    Hay fever    Multiple allergies    OSA (obstructive sleep apnea)      Review of Systems  Constitutional:  Negative for chills and fever.  Gastrointestinal:  Positive for abdominal pain (epigastric, RUQ) and nausea. Negative for blood in stool and vomiting.  All other systems reviewed and are negative.     Objective:   Physical Exam Vitals and nursing note reviewed.  Constitutional:      Appearance: Normal appearance.  HENT:     Head: Normocephalic and atraumatic.     Mouth/Throat:     Mouth: Mucous membranes are moist.  Eyes:     Extraocular Movements: Extraocular movements intact.     Conjunctiva/sclera: Conjunctivae normal.     Pupils: Pupils are equal, round, and reactive to light.  Cardiovascular:     Rate and Rhythm: Normal rate and regular rhythm.  Pulmonary:     Effort: Pulmonary effort is normal.     Breath sounds: Normal breath sounds.  Abdominal:     General: Bowel sounds are normal. There is no distension.     Palpations: Abdomen is soft. There is no mass.     Tenderness: There is abdominal tenderness (RUQ, epigastric area). There is no guarding or rebound.     Hernia: No hernia is present.  Musculoskeletal:        General: Normal range of motion.  Skin:    General: Skin is warm and dry.     Capillary Refill: Capillary refill takes less than 2 seconds.  Neurological:     General: No focal deficit present.     Mental Status: He is alert and oriented to person, place, and time.  Psychiatric:        Mood and Affect: Mood normal.        Behavior: Behavior normal.        Thought Content:  Thought content normal.        Judgment: Judgment normal.          Recent Results (from the past 2160 hour(s))  Specimen status report     Status: None   Collection Time: 11/26/21 12:00 AM  Result Value Ref Range   specimen status report Comment     Comment: Ambiguous Test Order Ambiguous Test Order Report delayed in order to contact you to clarify requested test(s). Contacted Ruthell Rummage at your facility on 11/29/2021. Requested Test [phone] Iron and TIBC           Test [id number] Ferritin   Iron and TIBC     Status: Abnormal   Collection Time: 11/26/21 12:00 AM  Result Value Ref Range   Total Iron Binding Capacity 283 250 - 450 ug/dL   UIBC 117 111 - 343 ug/dL   Iron 166 38 - 169 ug/dL   Iron Saturation 59 (H) 15 - 55 %  Ferritin     Status: Abnormal   Collection Time: 11/26/21 12:00 AM  Result Value Ref Range   Ferritin 434 (H) 30 - 400 ng/mL  Specimen status report  Status: None   Collection Time: 11/26/21 12:00 AM  Result Value Ref Range   specimen status report Comment     Comment: Written Authorization Written Authorization No Written Authorization Received.   Iron, TIBC and Ferritin Panel     Status: None   Collection Time: 12/31/21  8:48 AM  Result Value Ref Range   Total Iron Binding Capacity 279 250 - 450 ug/dL   UIBC 143 111 - 343 ug/dL   Iron 136 38 - 169 ug/dL   Iron Saturation 49 15 - 55 %   Ferritin 378 30 - 400 ng/mL  CMP12+LP+TP+TSH+6AC+PSA+CBC     Status: Abnormal   Collection Time: 01/17/22  9:58 AM  Result Value Ref Range   Glucose 89 70 - 99 mg/dL   Uric Acid 5.2 3.8 - 8.4 mg/dL    Comment:            Therapeutic target for gout patients: <6.0   BUN 20 6 - 24 mg/dL   Creatinine, Ser 0.88 0.76 - 1.27 mg/dL   eGFR 110 >59 mL/min/1.73   BUN/Creatinine Ratio 23 (H) 9 - 20   Sodium 139 134 - 144 mmol/L   Potassium 4.0 3.5 - 5.2 mmol/L   Chloride 102 96 - 106 mmol/L   Calcium 9.6 8.7 - 10.2 mg/dL   Phosphorus 3.4 2.8 - 4.1 mg/dL   Total  Protein 6.6 6.0 - 8.5 g/dL   Albumin 4.6 4.0 - 5.0 g/dL   Globulin, Total 2.0 1.5 - 4.5 g/dL   Albumin/Globulin Ratio 2.3 (H) 1.2 - 2.2   Bilirubin Total 0.6 0.0 - 1.2 mg/dL   Alkaline Phosphatase 63 44 - 121 IU/L   LDH 113 (L) 121 - 224 IU/L   AST 17 0 - 40 IU/L   ALT 13 0 - 44 IU/L   GGT 14 0 - 65 IU/L   Iron 203 (H) 38 - 169 ug/dL   Cholesterol, Total 152 100 - 199 mg/dL   Triglycerides 158 (H) 0 - 149 mg/dL   HDL 43 >39 mg/dL   VLDL Cholesterol Cal 27 5 - 40 mg/dL   LDL Chol Calc (NIH) 82 0 - 99 mg/dL   Chol/HDL Ratio 3.5 0.0 - 5.0 ratio    Comment:                                   T. Chol/HDL Ratio                                             Men  Women                               1/2 Avg.Risk  3.4    3.3                                   Avg.Risk  5.0    4.4                                2X Avg.Risk  9.6    7.1  3X Avg.Risk 23.4   11.0    Estimated CHD Risk 0.6 0.0 - 1.0 times avg.    Comment: The CHD Risk is based on the T. Chol/HDL ratio. Other factors affect CHD Risk such as hypertension, smoking, diabetes, severe obesity, and family history of premature CHD.    TSH 2.190 0.450 - 4.500 uIU/mL   T4, Total 6.7 4.5 - 12.0 ug/dL   T3 Uptake Ratio 26 24 - 39 %   Free Thyroxine Index 1.7 1.2 - 4.9   Prostate Specific Ag, Serum 0.8 0.0 - 4.0 ng/mL    Comment: Roche ECLIA methodology. According to the American Urological Association, Serum PSA should decrease and remain at undetectable levels after radical prostatectomy. The AUA defines biochemical recurrence as an initial PSA value 0.2 ng/mL or greater followed by a subsequent confirmatory PSA value 0.2 ng/mL or greater. Values obtained with different assay methods or kits cannot be used interchangeably. Results cannot be interpreted as absolute evidence of the presence or absence of malignant disease.    WBC 6.7 3.4 - 10.8 x10E3/uL   RBC 4.92 4.14 - 5.80 x10E6/uL   Hemoglobin 15.6  13.0 - 17.7 g/dL   Hematocrit 44.6 37.5 - 51.0 %   MCV 91 79 - 97 fL   MCH 31.7 26.6 - 33.0 pg   MCHC 35.0 31.5 - 35.7 g/dL   RDW 12.1 11.6 - 15.4 %   Platelets 226 150 - 450 x10E3/uL   Neutrophils 47 Not Estab. %   Lymphs 40 Not Estab. %   Monocytes 11 Not Estab. %   Eos 1 Not Estab. %   Basos 1 Not Estab. %   Neutrophils Absolute 3.1 1.4 - 7.0 x10E3/uL   Lymphocytes Absolute 2.7 0.7 - 3.1 x10E3/uL   Monocytes Absolute 0.7 0.1 - 0.9 x10E3/uL   EOS (ABSOLUTE) 0.1 0.0 - 0.4 x10E3/uL   Basophils Absolute 0.1 0.0 - 0.2 x10E3/uL   Immature Granulocytes 0 Not Estab. %   Immature Grans (Abs) 0.0 0.0 - 0.1 x10E3/uL  VITAMIN D 25 Hydroxy (Vit-D Deficiency, Fractures)     Status: Abnormal   Collection Time: 01/17/22  9:58 AM  Result Value Ref Range   Vit D, 25-Hydroxy 21.6 (L) 30.0 - 100.0 ng/mL    Comment: Vitamin D deficiency has been defined by the Institute of Medicine and an Endocrine Society practice guideline as a level of serum 25-OH vitamin D less than 20 ng/mL (1,2). The Endocrine Society went on to further define vitamin D insufficiency as a level between 21 and 29 ng/mL (2). 1. IOM (Institute of Medicine). 2010. Dietary reference    intakes for calcium and D. [place]: The    [employer]. 2. Holick MF, Binkley [place], Bischoff-Ferrari HA, et al.    Evaluation, treatment, and prevention of vitamin D    deficiency: an Endocrine Society clinical practice    guideline. JCEM. 2011 Jul; 96(7):1911-30.   Lipase     Status: None   Collection Time: 01/17/22  9:58 AM  Result Value Ref Range   Lipase 25 13 - 78 U/L  Specimen status report     Status: None (Preliminary result)   Collection Time: 01/17/22  9:58 AM  Result Value Ref Range   specimen status report Comment     Comment: No [place] Received  H. pylori breath test     Status: None   Collection Time: 01/28/22  4:53 PM  Result Value Ref Range   H. pylori Breath Test NOT  DETECTED NOT DETECTED    Comment:  . Antimicrobials, proton pump inhibitors, and bismuth preparations are known to suppress H. pylori, and  ingestion of these prior to H. pylori diagnostic testing may lead to false negative results. If clinically  indicated, the test may be repeated on a new specimen obtained two weeks after discontinuing treatment. However, a positive result is still clinically valid.   Celiac Disease Panel     Status: None   Collection Time: 02/02/22  7:48 AM  Result Value Ref Range   Endomysial IgA Negative Negative   Transglutaminase IgA <2 0 - 3 U/mL    Comment:                               Negative        0 -  3                               Weak Positive   4 - 10                               Positive           >10  Tissue Transglutaminase (tTG) has been identified  as the endomysial antigen.  Studies have demonstr-  ated that endomysial IgA antibodies have over 99%  specificity for gluten sensitive enteropathy.    IgA/Immunoglobulin A, Serum 145 90 - 386 mg/dL  Urinalysis, Routine w reflex microscopic     Status: None   Collection Time: 02/02/22  7:48 AM  Result Value Ref Range   Specific Gravity, UA 1.025 1.005 - 1.030   pH, UA 5.5 5.0 - 7.5   Color, UA Yellow Yellow   Appearance Ur Clear Clear   Leukocytes,UA Negative Negative   Protein,UA Negative Negative/Trace   Glucose, UA Negative Negative   Ketones, UA Negative Negative   RBC, UA Negative Negative   Bilirubin, UA Negative Negative   Urobilinogen, Ur 0.2 0.2 - 1.0 mg/dL   Nitrite, UA Negative Negative   Microscopic Examination Comment     Comment: Microscopic not indicated and not performed.  Iron, TIBC and Ferritin Panel     Status: None   Collection Time: 02/02/22  7:48 AM  Result Value Ref Range   Total Iron Binding Capacity 254 250 - 450 ug/dL   UIBC 131 111 - 343 ug/dL   Iron 123 38 - 169 ug/dL   Iron Saturation 48 15 - 55 %   Ferritin 321 30 - 400 ng/mL    Assessment & Plan:  Upper abdomen pain Labs  reviewed Vit D deficiency to take OTC Vitamin D3 daily. Orders Placed This Encounter  Procedures   CT ABDOMEN PELVIS WO CONTRAST     wt: 189 / no spinal stimulator, body injector or glucose monitor / no needs / BCBS No to COVID Pt aware of $75 no-show fee. EPIC order/ [facility] w/ [phone]     Standing Status:   Future    Number of Occurrences:   1    Standing Expiration Date:   01/19/2023    Order Specific Question:   Preferred imaging location?    Answer:   [id number]    Order Specific Question:   Is Oral Contrast requested for this exam?    Answer:   Yes, Per Radiology  protocol    Order Specific Question:   Call Results- Best Contact Number?    Answer:   [phone]   No orders of the defined types were placed in this encounter.  Will call patient with results. Patient verbalizes understanding and has no questions at discharge.

## 2022-01-21 MED ORDER — METHYLPHENIDATE HCL 10 MG PO TABS: ORAL_TABLET | ORAL | 0 refills | Status: DC

## 2022-01-24 ENCOUNTER — Ambulatory Visit
Admission: RE | Admit: 2022-01-24 | Discharge: 2022-01-24 | Disposition: A | Discharge status: Home / Self Care | Payer: BC Managed Care – PPO | Source: Ambulatory Visit | Attending: Medical | Admitting: Medical

## 2022-01-24 ENCOUNTER — Encounter: Payer: Self-pay | Admitting: Medical

## 2022-01-24 DIAGNOSIS — R109 Unspecified abdominal pain: Secondary | ICD-10-CM | POA: Diagnosis not present

## 2022-01-24 DIAGNOSIS — R1010 Upper abdominal pain, unspecified: Secondary | ICD-10-CM

## 2022-01-28 ENCOUNTER — Encounter: Payer: Self-pay | Admitting: Family

## 2022-01-28 ENCOUNTER — Ambulatory Visit: Payer: BC Managed Care – PPO | Admitting: Family

## 2022-01-28 ENCOUNTER — Other Ambulatory Visit: Payer: Self-pay

## 2022-01-28 VITALS — BP 140/100 | HR 66 | Temp 97.7°F | Resp 16 | Ht 72.01 in | Wt 185.4 lb

## 2022-01-28 DIAGNOSIS — R1013 Epigastric pain: Secondary | ICD-10-CM | POA: Diagnosis not present

## 2022-01-28 MED ORDER — PANTOPRAZOLE SODIUM 20 MG PO TBEC: 20.0000 mg | DELAYED_RELEASE_TABLET | Freq: Every day | ORAL | 0 refills | Status: DC

## 2022-01-28 NOTE — Progress Notes (Signed)
Subjective:    Patient ID: Tyler Hobbs, male    DOB: [birth year], 41 y.o.   MRN: [id number]  CC: Tyler Hobbs is a 41 y.o. male who presents today for follow up.   HPI: Complains of epigastric pain after eating, improved. First episode approx 2 weeks ago. Occurred eating tuna melt and again after chicken wings  This is been ongoing for 3 months.   Worse after eating. Describes as sharp pain. Tried gasx and tums with improvement .  He has noticed spicy food cause epigastric burning and he has intentionally cut back on those foods over the past year.   He was seen in [place] by PA last month.   No trouble swallowing, hematuria,  fever, belching, burping, nausea, vomiting, constipation, diarrhea, weight loss.   No nsaids  Drinks alcohol on the weekends Drinks caffeine such as Red Bull      Seen for abdominal pain 01/19/2022 .  Ultrasound right upper quadrant normal.  CT a/p without focal liver abnormality, gallstones or biliary dilatation.  Kidneys  normal.  No acute findings CT abdomen pelvis  Vitamin D deficiency Elevated triglycerides, iron.   Not taking supplement iron.  HISTORY:  Past Medical History:  Diagnosis Date   Asthma    Chicken pox    Hay fever    Multiple allergies    OSA (obstructive sleep apnea)    Past Surgical History:  Procedure Laterality Date   ADENOIDECTOMY     TYMPANOSTOMY TUBE PLACEMENT     Family History  Problem Relation Age of Onset   Hypertension Father    Cancer Maternal Aunt        lung   Cancer Paternal Aunt        breast   Alcohol abuse Paternal Uncle    Arthritis Maternal Grandfather    Colon cancer Neg Hx    Prostate cancer Neg Hx     Allergies: Patient has no known allergies. Current Outpatient Medications on File Prior to Visit  Medication Sig Dispense Refill   albuterol (VENTOLIN HFA) 108 (90 Base) MCG/ACT inhaler Inhale 2 puffs into the lungs every 6 (six) hours as needed for wheezing or shortness of breath. 8 g 2    fluticasone (FLONASE) 50 MCG/ACT nasal spray Place 2 sprays into both nostrils daily. 16 g 6   loratadine (CLARITIN) 10 MG tablet Take 10 mg by mouth daily.     methylphenidate (RITALIN) 10 MG tablet 1.5 tablets in the morning, 1.5 tablets at noon and 1 tablet in the evening. 124 tablet 0   methylphenidate (RITALIN) 10 MG tablet 1.5 tablets in the am, 1.5 tablets at noon, and 1 tablet in the evening. 124 tablet 0   methylphenidate (RITALIN) 10 MG tablet 1.5 tablets in the am, 1.5 tablets at noon, and 1 tablet in the evening 30 tablet 0   Multiple Vitamins-Minerals (MULTIVITAMIN GUMMIES ADULTS) CHEW Chew by mouth 2 (two) times daily.  (Patient not taking: Reported on 01/28/2022)     No current facility-administered medications on file prior to visit.    Social History   Tobacco Use   Smoking status: Former    Types: Cigarettes    Quit date: 09/01/2001    Years since quitting: 20.4   Smokeless tobacco: Never  Substance Use Topics   Alcohol use: Yes    Alcohol/week: 3.0 - 4.0 standard drinks    Types: 3 - 4 Standard drinks or equivalent per week    Comment: both beer and liquor  Drug use: Never    Comment: weed. extacy.    Review of Systems  Constitutional:  Negative for chills and fever.  HENT:  Negative for trouble swallowing.   Respiratory:  Negative for cough.   Cardiovascular:  Negative for chest pain and palpitations.  Gastrointestinal:  Positive for abdominal pain. Negative for constipation, diarrhea, nausea and vomiting.     Objective:    BP (!) 140/100 (BP Location: Left Arm, Patient Position: Sitting, Cuff Size: Normal)    Pulse 66    Temp 97.7 F (36.5 C) (Oral)    Resp 16    Ht 6' 0.01" (1.829 m)    Wt 185 lb 6.4 oz (84.1 kg)    SpO2 98%    BMI 25.14 kg/m  BP Readings from Last 3 Encounters:  01/28/22 (!) 140/100  01/19/22 120/78  01/17/22 120/78   Wt Readings from Last 3 Encounters:  01/28/22 185 lb 6.4 oz (84.1 kg)  10/29/21 189 lb (85.7 kg)  05/19/21 189 lb  6.4 oz (85.9 kg)    Physical Exam Vitals reviewed.  Constitutional:      Appearance: Normal appearance. He is well-developed.  Cardiovascular:     Rate and Rhythm: Regular rhythm.     Heart sounds: Normal heart sounds.  Pulmonary:     Effort: Pulmonary effort is normal. No respiratory distress.     Breath sounds: Normal breath sounds. No wheezing, rhonchi or rales.  Abdominal:     General: Bowel sounds are normal. There is no distension.     Palpations: Abdomen is soft. Abdomen is not rigid. There is no fluid wave or mass.     Tenderness: There is no abdominal tenderness. There is no guarding or rebound. Negative signs include Murphy's sign and McBurney's sign.  Skin:    General: Skin is warm and dry.  Neurological:     Mental Status: He is alert.  Psychiatric:        Speech: Speech normal.        Behavior: Behavior normal.       Assessment & Plan:   Problem List Items Addressed This Visit       Other   Epigastric pain - Primary    No pain today.  Patient is nontoxic in appearance.  He is afebrile.  Discussed differentials including peptic ulcer disease, GERD, cholelithiasis. Less likely renal stone.  We agreed trial of Protonix 20 mg every morning for couple of weeks.  Pending H. pylori screening, ua, and iron stores which she will obtain through his employer. Close f/u and consider HIDA scan to evaluate for underlying gallbladder disease, consult with GI.       Relevant Medications   pantoprazole (PROTONIX) 20 MG tablet   Other Relevant Orders   Celiac Disease Panel   H. pylori breath test   Iron, TIBC and Ferritin Panel   Urinalysis     I am having [name] start on pantoprazole. I am also having him maintain his Multivitamin Gummies Adults, fluticasone, loratadine, albuterol, methylphenidate, methylphenidate, and methylphenidate.   Meds ordered this encounter  Medications   pantoprazole (PROTONIX) 20 MG tablet    Sig: Take 1 tablet (20 mg total) by  mouth daily. 1 hour to 30 minutes prior to breakfast    Dispense:  90 tablet    Refill:  0    Order Specific Question:   Supervising Provider    Answer:   Sherlene Shams [[id number]]    Return precautions given.  Risks, benefits, and alternatives of the medications and treatment plan prescribed today were discussed, and patient expressed understanding.   Education regarding symptom management and diagnosis given to patient on AVS.  Continue to follow with Burnard Hawthorne, FNP for routine health maintenance.   [name] and I agreed with plan.   Mable Paris, FNP

## 2022-01-28 NOTE — Patient Instructions (Addendum)
[place] labs and urine as discussed  Start Protonix 20 mg taken 30 minutes to 1 hour prior to breakfast every day.  Please monitor for recurrence of symptoms.  Please let me know how you are doing

## 2022-01-28 NOTE — Assessment & Plan Note (Addendum)
No pain today.  Patient is nontoxic in appearance.  He is afebrile.  Discussed differentials including peptic ulcer disease, GERD, cholelithiasis. Less likely renal stone.  We agreed trial of Protonix 20 mg every morning for couple of weeks.  Pending H. pylori screening, ua, and iron stores which she will obtain through his employer. Close f/u and consider HIDA scan to evaluate for underlying gallbladder disease, consult with GI.

## 2022-01-31 ENCOUNTER — Other Ambulatory Visit: Payer: Self-pay

## 2022-01-31 ENCOUNTER — Other Ambulatory Visit: Payer: Self-pay | Admitting: Family

## 2022-01-31 ENCOUNTER — Encounter: Payer: Self-pay | Admitting: Family

## 2022-01-31 ENCOUNTER — Ambulatory Visit: Payer: BC Managed Care – PPO | Admitting: Medical

## 2022-01-31 DIAGNOSIS — R1010 Upper abdominal pain, unspecified: Secondary | ICD-10-CM

## 2022-01-31 DIAGNOSIS — F988 Other specified behavioral and emotional disorders with onset usually occurring in childhood and adolescence: Secondary | ICD-10-CM

## 2022-01-31 LAB — H. PYLORI BREATH TEST: H. pylori Breath Test: NOT DETECTED

## 2022-01-31 MED ORDER — METHYLPHENIDATE HCL 10 MG PO TABS: ORAL_TABLET | ORAL | 0 refills | Status: DC

## 2022-01-31 NOTE — Progress Notes (Signed)
I looked up patient on [place] Controlled Substances Reporting System PMP AWARE and saw no activity that raised concern of inappropriate use.   

## 2022-01-31 NOTE — Progress Notes (Signed)
Called patient to confirm he saw CT of abdomen and next steps in treatment.  He saw his PCP , they did a breath test most likely for SIBO and /or H Pylori. He also has additional lab work ordered by his PCP for Wednesday  02/04/2022. PCP also put patient on Protonix 20 mg daily.  Patient still having discomfort  with eating solid foods.  I recommended GI referral but he wants to wait for his test results first.  I understand, if he needs anything please contact the office.  He verbalizes understanding nd has no questions at the end of our conversation.

## 2022-02-02 ENCOUNTER — Other Ambulatory Visit: Payer: Self-pay

## 2022-02-02 ENCOUNTER — Other Ambulatory Visit: Payer: BC Managed Care – PPO

## 2022-02-02 DIAGNOSIS — R1013 Epigastric pain: Secondary | ICD-10-CM

## 2022-02-04 LAB — URINALYSIS, ROUTINE W REFLEX MICROSCOPIC
Bilirubin, UA: NEGATIVE
Glucose, UA: NEGATIVE
Ketones, UA: NEGATIVE
Leukocytes,UA: NEGATIVE
Nitrite, UA: NEGATIVE
Protein,UA: NEGATIVE
RBC, UA: NEGATIVE
Specific Gravity, UA: 1.025 (ref 1.005–1.030)
Urobilinogen, Ur: 0.2 mg/dL (ref 0.2–1.0)
pH, UA: 5.5 (ref 5.0–7.5)

## 2022-02-04 LAB — CELIAC DISEASE PANEL
Endomysial IgA: NEGATIVE
IgA/Immunoglobulin A, Serum: 145 mg/dL (ref 90–386)
Transglutaminase IgA: <2 U/mL (ref 0–3)

## 2022-02-04 LAB — IRON,TIBC AND FERRITIN PANEL
Ferritin: 321 ng/mL (ref 30–400)
Iron Saturation: 48 % (ref 15–55)
Iron: 123 ug/dL (ref 38–169)
Total Iron Binding Capacity: 254 ug/dL (ref 250–450)
UIBC: 131 ug/dL (ref 111–343)

## 2022-02-14 ENCOUNTER — Encounter: Payer: Self-pay | Admitting: Family

## 2022-02-14 ENCOUNTER — Encounter: Payer: Self-pay | Admitting: Medical

## 2022-02-14 ENCOUNTER — Other Ambulatory Visit: Payer: Self-pay

## 2022-02-14 DIAGNOSIS — R1013 Epigastric pain: Secondary | ICD-10-CM

## 2022-02-14 MED ORDER — PANTOPRAZOLE SODIUM 20 MG PO TBEC: 20.0000 mg | DELAYED_RELEASE_TABLET | Freq: Every day | ORAL | 0 refills | Status: DC

## 2022-02-16 ENCOUNTER — Telehealth: Payer: BC Managed Care – PPO | Admitting: Family

## 2022-02-16 ENCOUNTER — Encounter: Payer: Self-pay | Admitting: Family

## 2022-02-16 DIAGNOSIS — G8929 Other chronic pain: Secondary | ICD-10-CM

## 2022-02-16 DIAGNOSIS — M545 Low back pain, unspecified: Secondary | ICD-10-CM

## 2022-02-16 DIAGNOSIS — M549 Dorsalgia, unspecified: Secondary | ICD-10-CM | POA: Insufficient documentation

## 2022-02-16 DIAGNOSIS — R1013 Epigastric pain: Secondary | ICD-10-CM | POA: Diagnosis not present

## 2022-02-16 MED ORDER — PANTOPRAZOLE SODIUM 20 MG PO TBEC: 20.0000 mg | DELAYED_RELEASE_TABLET | Freq: Two times a day (BID) | ORAL | 2 refills | Status: DC

## 2022-02-16 NOTE — Assessment & Plan Note (Addendum)
Sister has RA. Patient has morning stiffness.  Differential includes DDD, Axial spondyloarthritis.  Pendings labs, pelvis, and lumbar x-rays to further evaluate.  Consider referral to rheumatology

## 2022-02-16 NOTE — Patient Instructions (Signed)
Please obtain labs at [facility].  Please call to schedule x-rays of low back and pelvis when you can.

## 2022-02-16 NOTE — Assessment & Plan Note (Signed)
Pleased to hear currently resolved as he has increased Protonix from 20 mg before breakfast and before dinner.   Advised after couple weeks to wean down to 1 tablet daily and then to wean off gradually to use as needed due to long-term side effects of PPIs.   will continue to monitor.

## 2022-02-16 NOTE — Progress Notes (Signed)
Virtual Visit via Video Note  I connected with@  on 02/16/22 at  8:00 AM EST by a video enabled telemedicine application and verified that I am speaking with the correct person using two identifiers.  Location patient: home Location provider:work  Persons participating in the virtual visit: patient, provider  I discussed the limitations of evaluation and management by telemedicine and the availability of in person appointments. The patient expressed understanding and agreed to proceed.   HPI: Complains of 'achiness' in low back on spine. Occurs every morning and he has noticed this on going for months. Endorses stiffness. No inciting event. No injury. No particular movement that bothers low back pain. Has taken ibuprofen on occasion. He will apply heat with relief. He hasnt noticed if pain improves with exercise. No low back pain at night.  No hand or wrist pain. No edema, erythema, rash. No numbness or weakness in legs. No trouble urinating.   Seen for epigastric pain 01/28/2022 and started on Protonix 20 mg which has increased to protonix 20mg  qam and before dinner without abdominal pain.   Sister has lupus    ROS: See pertinent positives and negatives per HPI.    EXAM:  VITALS per patient if applicable: There were no vitals taken for this visit. BP Readings from Last 3 Encounters:  01/28/22 (!) 140/100  01/19/22 120/78  01/17/22 120/78   Wt Readings from Last 3 Encounters:  01/28/22 185 lb 6.4 oz (84.1 kg)  10/29/21 189 lb (85.7 kg)  05/19/21 189 lb 6.4 oz (85.9 kg)    GENERAL: alert, oriented, appears well and in no acute distress  HEENT: atraumatic, conjunttiva clear, no obvious abnormalities on inspection of external nose and ears  NECK: normal movements of the head and neck  LUNGS: on inspection no signs of respiratory distress, breathing rate appears normal, no obvious gross SOB, gasping or wheezing  CV: no obvious cyanosis  MS: moves all visible extremities  without noticeable abnormality  PSYCH/NEURO: pleasant and cooperative, no obvious depression or anxiety, speech and thought processing grossly intact  ASSESSMENT AND PLAN:  Discussed the following assessment and plan:  Problem List Items Addressed This Visit       Other   Epigastric pain    Pleased to hear currently resolved as he has increased Protonix from 20 mg before breakfast and before dinner.   Advised after couple weeks to wean down to 1 tablet daily and then to wean off gradually to use as needed due to long-term side effects of PPIs.   will continue to monitor.       Relevant Medications   pantoprazole (PROTONIX) 20 MG tablet   Low back pain - Primary    Sister has RA. Patient has morning stiffness.  Differential includes DDD, Axial spondyloarthritis.  Pendings labs, pelvis, and lumbar x-rays to further evaluate.  Consider referral to rheumatology       Relevant Orders   DG Lumbar Spine Complete   DG Hip Unilat W OR W/O Pelvis 2-3 Views Left   DG Hip Unilat W OR W/O Pelvis 2-3 Views Right   ANA   C-reactive protein   CYCLIC CITRUL PEPTIDE ANTIBODY, IGG/IGA   Rheumatoid factor   Sedimentation rate    -we discussed possible serious and likely etiologies, options for evaluation and workup, limitations of telemedicine visit vs in person visit, treatment, treatment risks and precautions. Pt prefers to treat via telemedicine empirically rather then risking or undertaking an in person visit at this moment.  Marland Kitchen  I discussed the assessment and treatment plan with the patient. The patient was provided an opportunity to ask questions and all were answered. The patient agreed with the plan and demonstrated an understanding of the instructions.   The patient was advised to call back or seek an in-person evaluation if the symptoms worsen or if the condition fails to improve as anticipated.   Mable Paris, FNP

## 2022-02-18 ENCOUNTER — Other Ambulatory Visit: Payer: Self-pay

## 2022-02-18 ENCOUNTER — Other Ambulatory Visit: Payer: BC Managed Care – PPO

## 2022-02-18 DIAGNOSIS — G8929 Other chronic pain: Secondary | ICD-10-CM

## 2022-02-18 DIAGNOSIS — M545 Low back pain, unspecified: Secondary | ICD-10-CM

## 2022-02-19 LAB — SPECIMEN STATUS REPORT

## 2022-02-20 LAB — CYCLIC CITRUL PEPTIDE ANTIBODY, IGG/IGA: Cyclic Citrullin Peptide Ab: 17 units (ref 0–19)

## 2022-02-21 ENCOUNTER — Other Ambulatory Visit: Payer: BC Managed Care – PPO

## 2022-02-21 ENCOUNTER — Other Ambulatory Visit: Payer: Self-pay

## 2022-02-21 ENCOUNTER — Ambulatory Visit: Payer: BC Managed Care – PPO

## 2022-02-21 ENCOUNTER — Ambulatory Visit (INDEPENDENT_AMBULATORY_CARE_PROVIDER_SITE_OTHER): Payer: BC Managed Care – PPO

## 2022-02-21 ENCOUNTER — Other Ambulatory Visit: Payer: Self-pay | Admitting: Family

## 2022-02-21 DIAGNOSIS — G8929 Other chronic pain: Secondary | ICD-10-CM

## 2022-02-21 DIAGNOSIS — M545 Low back pain, unspecified: Secondary | ICD-10-CM

## 2022-02-21 DIAGNOSIS — M160 Bilateral primary osteoarthritis of hip: Secondary | ICD-10-CM | POA: Diagnosis not present

## 2022-02-21 LAB — RHEUMATOID FACTOR: Rheumatoid fact SerPl-aCnc: <10 [IU]/mL (ref <14.0)

## 2022-02-21 LAB — C-REACTIVE PROTEIN: CRP: <1 mg/L (ref 0–10)

## 2022-02-21 LAB — SEDIMENTATION RATE

## 2022-02-21 LAB — ANA: Anti Nuclear Antibody (ANA): NEGATIVE

## 2022-02-22 ENCOUNTER — Encounter: Payer: Self-pay | Admitting: Family

## 2022-02-22 LAB — SEDIMENTATION RATE: Sed Rate: 2 mm/hr (ref 0–15)

## 2022-02-25 ENCOUNTER — Ambulatory Visit: Payer: BC Managed Care – PPO | Admitting: Family

## 2022-03-08 ENCOUNTER — Ambulatory Visit: Payer: BC Managed Care – PPO | Admitting: Medical

## 2022-03-08 ENCOUNTER — Other Ambulatory Visit: Payer: Self-pay

## 2022-03-08 VITALS — BP 120/84 | HR 68 | Temp 97.6°F | Resp 16

## 2022-03-08 DIAGNOSIS — B9789 Other viral agents as the cause of diseases classified elsewhere: Secondary | ICD-10-CM

## 2022-03-08 NOTE — Patient Instructions (Signed)
Viral Illness, Adult ?Viruses are tiny germs that can get into a person's body and cause illness. There are many different types of viruses, and they cause many types of illness. Viral illnesses can range from mild to severe. They can affect various parts of the body. ?Short-term conditions that are caused by a virus include colds and the flu (influenza). Long-term conditions that are caused by a virus include herpes, shingles, and HIV (human immunodeficiency virus) infection. A few viruses have been linked to certain cancers. ?What are the causes? ?Many types of viruses can cause illness. Viruses invade cells in your body, multiply, and cause the infected cells to work abnormally or die. When these cells die, they release more of the virus. When this happens, you develop symptoms of the illness, and the virus continues to spread to other cells. If the virus takes over the function of the cell, it can cause the cell to divide and grow out of control. This happens when a virus causes cancer. ?Different viruses get into the body in different ways. You can get a virus by: ?Swallowing food or water that has come in contact with the virus (is contaminated). ?Breathing in droplets that have been coughed or sneezed into the air by an infected person. ?Touching a surface that has been contaminated with the virus and then touching your eyes, nose, or mouth. ?Being bitten by an insect or animal that carries the virus. ?Having sexual contact with a person who is infected with the virus. ?Being exposed to blood or fluids that contain the virus, either through an open cut or during a transfusion. ?If a virus enters your body, your body's defense system (immune system) will try to fight the virus. You may be at higher risk for a viral illness if your immune system is weak. ?What are the signs or symptoms? ?You may have these symptoms, depending on the type of virus and the location of the cells that it invades: ?Cold and flu  viruses: ?Fever. ?Headache. ?Sore throat. ?Muscle aches. ?Stuffy nose (nasal congestion). ?Cough. ?Digestive system (gastrointestinal) viruses: ?Fever. ?Pain in the abdomen. ?Nausea. ?Diarrhea. ?Liver viruses (hepatitis): ?Loss of appetite. ?Tiredness. ?Skin or the white parts of your eyes turning yellow (jaundice). ?Brain and spinal cord viruses: ?Fever. ?Headache. ?Stiff neck. ?Nausea and vomiting. ?Confusion or sleepiness. ?Skin viruses: ?Warts. ?Itching. ?Rash. ?Sexually transmitted viruses: ?Discharge. ?Swelling. ?Redness. ?Rash. ?How is this diagnosed? ?This condition may be diagnosed based on one or more of the following: ?Symptoms. ?Medical history. ?Physical exam. ?Blood test, sample of mucus from your lungs (sputum sample), stool sample, or a swab of body fluids or a skin sore (lesion). ?How is this treated? ?Viruses can be hard to treat because they live within cells. Antibiotic medicines do not treat viruses because these medicines do not get inside cells. Treatment for a viral illness may include: ?Resting and drinking plenty of fluids. ?Medicines to relieve symptoms. These can include over-the-counter medicine for pain and fever, medicines for cough or congestion, and medicines to relieve diarrhea. ?Antiviral medicines. These medicines are available only for certain types of viruses. ?Some viral illnesses can be prevented with vaccinations. A common example is the flu shot. ?Follow these instructions at home: ?Medicines ?Take over-the-counter and prescription medicines only as told by your health care provider. ?If you were prescribed an antiviral medicine, take it as told by your health care provider. Do not stop taking the antiviral even if you start to feel better. ?Be aware of when antibiotics are   needed and when they are not needed. Antibiotics do not treat viruses. You may get an antibiotic if your health care provider thinks that you may have, or are at risk for, a bacterial infection and you  have a viral infection. ?Do not ask for an antibiotic prescription if you have been diagnosed with a viral illness. Antibiotics will not make your illness go away faster. ?Frequently taking antibiotics when they are not needed can lead to antibiotic resistance. When this develops, the medicine no longer works against the bacteria that it normally fights. ?General instructions ? ?Drink enough fluids to keep your urine pale yellow. ?Rest as much as possible. ?Return to your normal activities as told by your health care provider. Ask your health care provider what activities are safe for you. ?Keep all follow-up visits as told by your health care provider. This is important. ?How is this prevented? ?To reduce your risk of viral illness: ?Wash your hands often with soap and water for at least 20 seconds. If soap and water are not available, use hand sanitizer. ?Avoid touching your nose, eyes, and mouth, especially if you have not washed your hands recently. ?If anyone in your household has a viral infection, clean all household surfaces that may have been in contact with the virus. Use soap and hot water. You may also use bleach that you have added water to (diluted). ?Stay away from people who are sick with symptoms of a viral infection. ?Do not share items such as toothbrushes and water bottles with other people. ?Keep your vaccinations up to date. This includes getting a yearly flu shot. ?Eat a healthy diet and get plenty of rest. ?Contact a health care provider if: ?You have symptoms of a viral illness that do not go away. ?Your symptoms come back after going away. ?Your symptoms get worse. ?Get help right away if you have: ?Trouble breathing. ?A severe headache or a stiff neck. ?Severe vomiting or pain in your abdomen. ?These symptoms may represent a serious problem that is an emergency. Do not wait to see if the symptoms will go away. Get medical help right away. Call your local emergency services (911 in the  [place] Do not drive yourself to the hospital. ?Summary ?Viruses are types of germs that can get into a person's body and cause illness. Viral illnesses can range from mild to severe. They can affect various parts of the body. ?Viruses can be hard to treat. There are medicines to relieve symptoms, and there are some antiviral medicines. ?If you were prescribed an antiviral medicine, take it as told by your health care provider. Do not stop taking the antiviral even if you start to feel better. ?Contact a health care provider if you have symptoms of a viral illness that do not go away. ?This information is not intended to replace advice given to you by your health care provider. Make sure you discuss any questions you have with your health care provider. ?Document Revised: 04/27/2020 Document Reviewed: 10/22/2019 ?Elsevier Patient Education ? 2022 [employer] ? ?

## 2022-03-28 NOTE — Progress Notes (Signed)
? ?  Subjective:  ? ? Patient ID: Tyler Hobbs, male    DOB: [birth year], 41 y.o.   MRN: [id number] ? ?HPI ?41 yo  male in non acute distress presents today with nasal congestion, cough , sore throat and sneezing starting yesterday. Denies fever and chills. ? ?Blood pressure 120/84, pulse 68, temperature 97.6 ?F (36.4 ?C), temperature source Tympanic, resp. rate 16, SpO2 98 %. ? ? ? ?Review of Systems  ?Constitutional:  Negative for chills and fever.  ?HENT:  Positive for congestion, sneezing and sore throat.   ?Respiratory:  Positive for cough.   ? ?   ?Objective:  ? Physical Exam ?Vitals and nursing note reviewed.  ?Constitutional:   ?   Appearance: Normal appearance. He is normal weight.  ?HENT:  ?   Head: Normocephalic and atraumatic.  ?   Right Ear: Tympanic membrane, ear canal and external ear normal.  ?   Left Ear: Tympanic membrane, ear canal and external ear normal.  ?   Nose: Congestion present.  ?   Mouth/Throat:  ?   Mouth: Mucous membranes are moist.  ?   Pharynx: Oropharynx is clear.  ?Eyes:  ?   Extraocular Movements: Extraocular movements intact.  ?   Conjunctiva/sclera: Conjunctivae normal.  ?   Pupils: Pupils are equal, round, and reactive to light.  ?Cardiovascular:  ?   Rate and Rhythm: Normal rate and regular rhythm.  ?   Pulses: Normal pulses.  ?   Heart sounds: Normal heart sounds.  ?Pulmonary:  ?   Effort: Pulmonary effort is normal.  ?   Breath sounds: Normal breath sounds.  ?Musculoskeletal:     ?   General: Normal range of motion.  ?   Cervical back: Normal range of motion and neck supple.  ?Skin: ?   General: Skin is warm and dry.  ?Neurological:  ?   General: No focal deficit present.  ?   Mental Status: He is alert and oriented to person, place, and time. Mental status is at baseline.  ?Psychiatric:     ?   Mood and Affect: Mood normal.     ?   Behavior: Behavior normal.     ?   Thought Content: Thought content normal.  ? ? ? ? ? ?   ?Assessment & Plan:  ?Viral sinusitis ?Rest , increase  fluids, OTCMotirn or Tylenol for fever or chills.  ?Patient to follow up in 5-7 days if worsening. Patient verbalizes understanding and has no questions at discharge. ? ?

## 2022-04-23 ENCOUNTER — Other Ambulatory Visit: Payer: Self-pay | Admitting: Family

## 2022-04-23 DIAGNOSIS — F988 Other specified behavioral and emotional disorders with onset usually occurring in childhood and adolescence: Secondary | ICD-10-CM

## 2022-04-25 ENCOUNTER — Other Ambulatory Visit: Payer: Self-pay | Admitting: Family

## 2022-04-25 DIAGNOSIS — F988 Other specified behavioral and emotional disorders with onset usually occurring in childhood and adolescence: Secondary | ICD-10-CM

## 2022-04-25 MED ORDER — METHYLPHENIDATE HCL 10 MG PO TABS: ORAL_TABLET | ORAL | 0 refills | Status: DC

## 2022-05-16 ENCOUNTER — Encounter: Payer: Self-pay | Admitting: Family

## 2022-05-16 ENCOUNTER — Ambulatory Visit: Payer: BC Managed Care – PPO | Admitting: Family

## 2022-05-16 VITALS — BP 110/72 | HR 68 | Temp 97.8°F | Ht 72.0 in | Wt 188.4 lb

## 2022-05-16 DIAGNOSIS — F988 Other specified behavioral and emotional disorders with onset usually occurring in childhood and adolescence: Secondary | ICD-10-CM | POA: Diagnosis not present

## 2022-05-16 DIAGNOSIS — M545 Low back pain, unspecified: Secondary | ICD-10-CM | POA: Diagnosis not present

## 2022-05-16 DIAGNOSIS — G8929 Other chronic pain: Secondary | ICD-10-CM

## 2022-05-16 DIAGNOSIS — K219 Gastro-esophageal reflux disease without esophagitis: Secondary | ICD-10-CM | POA: Diagnosis not present

## 2022-05-16 LAB — URINALYSIS, ROUTINE W REFLEX MICROSCOPIC
Bilirubin Urine: NEGATIVE
Hgb urine dipstick: NEGATIVE
Ketones, ur: NEGATIVE
Leukocytes,Ua: NEGATIVE
Nitrite: NEGATIVE
RBC / HPF: NONE SEEN (ref 0–?)
Specific Gravity, Urine: 1.015 (ref 1.000–1.030)
Total Protein, Urine: NEGATIVE
Urine Glucose: NEGATIVE
Urobilinogen, UA: 1 (ref 0.0–1.0)
pH: 7 (ref 5.0–8.0)

## 2022-05-16 NOTE — Assessment & Plan Note (Addendum)
Back pain this is a more diffuse presentation.M orning stiffness - thoracic to lumbar spine- approx 2 mornings per week. Occasional sciatic symptoms. No small joints, hand or wrists bothering pain. His sister has SLE.  Inflammatory labs are normal and lumbar xray  ( pending thoracic xr) are unrevealing thus far.  Differential would include  PMR and I have sent staff message to rheumatology, Dr Allena Katz, in regards to arranging consult. Advised patient to get back in touch with me if he doesn't  Hear from me regarding Dr Allena Katz. In the meantime, we agreed to trial PT

## 2022-05-16 NOTE — Assessment & Plan Note (Signed)
Chronic, symptomatically stable as patient has  identified trigger foods.  He will continue Protonix 20 mg twice daily.Advised that he could add pepcid OTC nightly if needed. Will monitor.

## 2022-05-16 NOTE — Progress Notes (Signed)
Subjective:    Patient ID: Tyler Hobbs, male    DOB: 1981-11-06, 41 y.o.   MRN: [id number]  CC: Tyler Hobbs is a 41 y.o. male who presents today for follow up.   HPI: Feels well today No new complaints  Back pain- can vary between upper back and low back.  Pain occurs twice per week. More often dull however times when more severe.  he continues to have morning stiffness. Stretching with some relief. Occasional numbness in left posterior leg  No injury, hand or wrist pain, trouble urinating.  Doing weights at home.   Epigastric pain-Triggered by eating greens and spicy.  He has made dietary changes and he keep  compliant Protonix 20 mg BID with relief.   ADHD-compliant with Ritalin 10 mg which is working well.  No increased anxiety or trouble sleeping   HISTORY:  Past Medical History:  Diagnosis Date   Asthma    Chicken pox    Hay fever    Multiple allergies    OSA (obstructive sleep apnea)    Past Surgical History:  Procedure Laterality Date   ADENOIDECTOMY     TYMPANOSTOMY TUBE PLACEMENT     Family History  Problem Relation Age of Onset   Hypertension Father    Lupus Sister    Arthritis Maternal Grandfather    Cancer Maternal Aunt        lung   Cancer Paternal Aunt        breast   Alcohol abuse Paternal Uncle    Colon cancer Neg Hx    Prostate cancer Neg Hx     Allergies: Patient has no known allergies. Current Outpatient Medications on File Prior to Visit  Medication Sig Dispense Refill   albuterol (VENTOLIN HFA) 108 (90 Base) MCG/ACT inhaler Inhale 2 puffs into the lungs every 6 (six) hours as needed for wheezing or shortness of breath. 8 g 2   fluticasone (FLONASE) 50 MCG/ACT nasal spray Place 2 sprays into both nostrils daily. 16 g 6   loratadine (CLARITIN) 10 MG tablet Take 10 mg by mouth daily.     methylphenidate (RITALIN) 10 MG tablet 1.5 tablets in the morning, 1.5 tablets at noon and 1 tablet in the evening. 124 tablet 0   methylphenidate  (RITALIN) 10 MG tablet 1.5 tablets in the am, 1.5 tablets at noon, and 1 tablet in the evening. 124 tablet 0   methylphenidate (RITALIN) 10 MG tablet 1.5 tablets in the am, 1.5 tablets at noon, and 1 tablet in the evening 124 tablet 0   Multiple Vitamins-Minerals (MULTIVITAMIN GUMMIES ADULTS) CHEW Chew by mouth 2 (two) times daily.     pantoprazole (PROTONIX) 20 MG tablet Take 1 tablet (20 mg total) by mouth 2 (two) times daily before a meal. 1 hour to 30 minutes prior to breakfast and before dinner 60 tablet 2   No current facility-administered medications on file prior to visit.    Social History   Tobacco Use   Smoking status: Former    Types: Cigarettes    Quit date: 09/01/2001    Years since quitting: 20.7   Smokeless tobacco: Never  Substance Use Topics   Alcohol use: Yes    Alcohol/week: 3.0 - 4.0 standard drinks    Types: 3 - 4 Standard drinks or equivalent per week    Comment: both beer and liquor   Drug use: Never    Comment: weed. extacy.    Review of Systems  Constitutional:  Negative for chills  and fever.  HENT:  Negative for congestion, ear pain, rhinorrhea, sinus pressure and sore throat.   Respiratory:  Negative for cough, shortness of breath and wheezing.   Cardiovascular:  Negative for chest pain and palpitations.  Gastrointestinal:  Negative for diarrhea, nausea and vomiting.  Genitourinary:  Negative for difficulty urinating and dysuria.  Musculoskeletal:  Positive for back pain. Negative for myalgias and neck pain.  Skin:  Negative for rash.  Neurological:  Positive for numbness. Negative for headaches.  Hematological:  Negative for adenopathy.     Objective:    BP 110/72 (BP Location: Left Arm, Patient Position: Sitting, Cuff Size: Large)   Pulse 68   Temp 97.8 F (36.6 C) (Oral)   Ht 6' (1.829 m)   Wt 188 lb 6.4 oz (85.5 kg)   SpO2 98%   BMI 25.55 kg/m  BP Readings from Last 3 Encounters:  05/16/22 110/72  03/08/22 120/84  01/28/22 (!) 140/100    Wt Readings from Last 3 Encounters:  05/16/22 188 lb 6.4 oz (85.5 kg)  01/28/22 185 lb 6.4 oz (84.1 kg)  10/29/21 189 lb (85.7 kg)    Physical Exam Vitals reviewed.  Constitutional:      Appearance: He is well-developed.  Cardiovascular:     Rate and Rhythm: Regular rhythm.     Heart sounds: Normal heart sounds.  Pulmonary:     Effort: Pulmonary effort is normal. No respiratory distress.     Breath sounds: Normal breath sounds. No wheezing, rhonchi or rales.  Skin:    General: Skin is warm and dry.  Neurological:     Mental Status: He is alert.  Psychiatric:        Speech: Speech normal.        Behavior: Behavior normal.       Assessment & Plan:   Problem List Items Addressed This Visit       Digestive   Gastroesophageal reflux disease    Chronic, symptomatically stable as patient has  identified trigger foods.  He will continue Protonix 20 mg twice daily.Advised that he could add pepcid OTC nightly if needed. Will monitor.          Other   Adult attention deficit disorder    Chronic, stable.  Continue Ritalin 10 mg       Back pain - Primary    Back pain this is a more diffuse presentation.M orning stiffness - thoracic to lumbar spine- approx 2 mornings per week. Occasional sciatic symptoms. No small joints, hand or wrists bothering pain. His sister has SLE.  Inflammatory labs are normal and lumbar xray  ( pending thoracic xr) are unrevealing thus far.  Differential would include  PMR and I have sent staff message to rheumatology, Dr Allena Katz, in regards to arranging consult. Advised patient to get back in touch with me if he doesn't  Hear from me regarding Dr Allena Katz. In the meantime, we agreed to trial PT      Relevant Orders   Ambulatory referral to Physical Therapy   DG Thoracic Spine W/Swimmers   Urinalysis, Routine w reflex microscopic     I am having Tyler Hobbs maintain his Multivitamin Gummies Adults, fluticasone, loratadine, albuterol,  pantoprazole, methylphenidate, methylphenidate, and methylphenidate.   No orders of the defined types were placed in this encounter.   Return precautions given.   Risks, benefits, and alternatives of the medications and treatment plan prescribed today were discussed, and patient expressed understanding.   Education regarding symptom management and  diagnosis given to patient on AVS.  Continue to follow with Aleksis Jiggetts G, FNP Allegra Granafor routine health maintenance.   Tyler OhmPatrick Scobee and I agreed with plan.   Rennie PlowmanMargaret Dorothyann Mourer, FNP

## 2022-05-16 NOTE — Patient Instructions (Signed)
Referral to physical therapy Please call us to schedule your thoracic x-ray when you can

## 2022-05-16 NOTE — Assessment & Plan Note (Signed)
Chronic, stable.  Continue Ritalin 10 mg

## 2022-06-07 ENCOUNTER — Telehealth: Payer: Self-pay | Admitting: Family

## 2022-06-07 ENCOUNTER — Other Ambulatory Visit: Payer: Self-pay

## 2022-06-07 DIAGNOSIS — M545 Low back pain, unspecified: Secondary | ICD-10-CM

## 2022-06-07 NOTE — Telephone Encounter (Signed)
I have spoken with patient & he was okay with seeing Dr. Allena Katz. I have placed referral.

## 2022-06-07 NOTE — Telephone Encounter (Signed)
-----   Message from Mayur Concha Se, MD sent at 06/03/2022 10:23 AM EDT ----- PMR would not be common for his age.  It would be reasonable to evaluate for other spondyloarthropathy.  We can take a look.   ----- Message ----- From: Allegra Grana, FNP Sent: 05/16/2022   1:10 PM EDT To: Patterson Hammersmith, MD  Dr Allena Katz,  [facility] you are well. I wanted to see if appropriate referral for this gentleman who complains of morning stiffness - thoracic to lumbar spine- approx 2 mornings per week. Occasional sciatic symptoms. No small joints, hand or wrists bothering him.  His sister has SLE.  Inflammatory labs are normal and lumbar xray  ( pending thoracic xr) are unrevealing.  On differential I considered PMR- do you think referral to you is appropriate?   Claris Che

## 2022-06-07 NOTE — Telephone Encounter (Signed)
Call patient I received an very nice note from rheumatology, Dr. Allena Katz.  He advised it would be reasonable to evaluate for spondyloarthropathy  Please place rheumatology referral to Dr Allena Katz  Reason: back pain, morning stiffness, eval  spondyloarthropathy

## 2022-07-05 DIAGNOSIS — Z7960 Long term (current) use of unspecified immunomodulators and immunosuppressants: Secondary | ICD-10-CM | POA: Diagnosis not present

## 2022-07-05 DIAGNOSIS — M47819 Spondylosis without myelopathy or radiculopathy, site unspecified: Secondary | ICD-10-CM | POA: Diagnosis not present

## 2022-07-05 DIAGNOSIS — M533 Sacrococcygeal disorders, not elsewhere classified: Secondary | ICD-10-CM | POA: Diagnosis not present

## 2022-07-05 DIAGNOSIS — Z111 Encounter for screening for respiratory tuberculosis: Secondary | ICD-10-CM | POA: Diagnosis not present

## 2022-07-22 ENCOUNTER — Other Ambulatory Visit: Payer: Self-pay | Admitting: Family

## 2022-07-22 DIAGNOSIS — F988 Other specified behavioral and emotional disorders with onset usually occurring in childhood and adolescence: Secondary | ICD-10-CM

## 2022-07-23 MED ORDER — METHYLPHENIDATE HCL 10 MG PO TABS: ORAL_TABLET | ORAL | 0 refills | Status: DC

## 2022-08-17 DIAGNOSIS — M45A0 Non-radiographic axial spondyloarthritis of unspecified sites in spine: Secondary | ICD-10-CM | POA: Diagnosis not present

## 2022-08-17 DIAGNOSIS — M47819 Spondylosis without myelopathy or radiculopathy, site unspecified: Secondary | ICD-10-CM | POA: Diagnosis not present

## 2022-08-19 ENCOUNTER — Telehealth: Payer: Self-pay | Admitting: Family

## 2022-08-19 NOTE — Telephone Encounter (Addendum)
Spoke to patient in regards to his message and he is just concerned about his refill Of Ritalin. I explained to him that Claris Che would be in office on Mon and I could have her to refill it  for him then.Patient was ok with waiting until then. He stated that he has enough to last.

## 2022-08-19 NOTE — Telephone Encounter (Signed)
Patient's appointment had to be rescheduled because provider had to reschedule. He needs a refill on his  methylphenidate (RITALIN) 10 MG tablet

## 2022-08-23 ENCOUNTER — Other Ambulatory Visit: Payer: Self-pay | Admitting: Family

## 2022-08-23 DIAGNOSIS — F988 Other specified behavioral and emotional disorders with onset usually occurring in childhood and adolescence: Secondary | ICD-10-CM

## 2022-08-23 MED ORDER — METHYLPHENIDATE HCL 10 MG PO TABS: ORAL_TABLET | ORAL | 0 refills | Status: DC

## 2022-08-23 NOTE — Progress Notes (Signed)
I looked up patient on [place] Controlled Substances Reporting System PMP AWARE and saw no activity that raised concern of inappropriate use.   

## 2022-08-24 ENCOUNTER — Other Ambulatory Visit: Payer: Self-pay | Admitting: Family

## 2022-08-24 ENCOUNTER — Ambulatory Visit: Payer: BC Managed Care – PPO | Admitting: Family

## 2022-08-24 ENCOUNTER — Encounter: Payer: Self-pay | Admitting: Family

## 2022-08-24 DIAGNOSIS — F988 Other specified behavioral and emotional disorders with onset usually occurring in childhood and adolescence: Secondary | ICD-10-CM

## 2022-09-05 ENCOUNTER — Ambulatory Visit: Payer: BC Managed Care – PPO | Admitting: Family

## 2022-09-12 ENCOUNTER — Ambulatory Visit: Payer: BC Managed Care – PPO | Admitting: Family

## 2022-09-12 ENCOUNTER — Encounter: Payer: Self-pay | Admitting: Family

## 2022-09-12 DIAGNOSIS — F988 Other specified behavioral and emotional disorders with onset usually occurring in childhood and adolescence: Secondary | ICD-10-CM | POA: Diagnosis not present

## 2022-09-12 DIAGNOSIS — M545 Low back pain, unspecified: Secondary | ICD-10-CM | POA: Diagnosis not present

## 2022-09-12 DIAGNOSIS — G8929 Other chronic pain: Secondary | ICD-10-CM

## 2022-09-12 NOTE — Assessment & Plan Note (Signed)
Chronic, stable.  Currently following with rheumatology and pursuing MRI lumbar spine.  Will follow

## 2022-09-12 NOTE — Assessment & Plan Note (Signed)
Chronic, stable.  Continue Ritalin 10 mg as prescribed.

## 2022-09-12 NOTE — Progress Notes (Signed)
Subjective:    Patient ID: Tyler Hobbs, male    DOB: 05-09-1981, 41 y.o.   MRN: [id number]  CC: Tyler Hobbs is a 41 y.o. male who presents today for follow up.   HPI: Feels well today.  No new complaints    ADHD-compliant with Ritalin 10 mg and feels dose and regimen is currently working well for him.  No increased anxiety or trouble sleeping   Low back and hip pain is overall unchanged. Comes and goes. He continues to have morning stiffness.   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  He is compliant with sulfasalazine, folic acid, celebrex.     HISTORY:  Past Medical History:  Diagnosis Date   Asthma    Chicken pox    Hay fever    Multiple allergies    OSA (obstructive sleep apnea)    Past Surgical History:  Procedure Laterality Date   ADENOIDECTOMY     TYMPANOSTOMY TUBE PLACEMENT     Family History  Problem Relation Age of Onset   Hypertension Father    Lupus Sister    Arthritis Maternal Grandfather    Cancer Maternal Aunt        lung   Cancer Paternal Aunt        breast   Alcohol abuse Paternal Uncle    Colon cancer Neg Hx    Prostate cancer Neg Hx     Allergies: Patient has no known allergies. Current Outpatient Medications on File Prior to Visit  Medication Sig Dispense Refill   albuterol (VENTOLIN HFA) 108 (90 Base) MCG/ACT inhaler Inhale 2 puffs into the lungs every 6 (six) hours as needed for wheezing or shortness of breath. 8 g 2   celecoxib (CELEBREX) 100 MG capsule Take 100 mg by mouth 2 (two) times daily.     fluticasone (FLONASE) 50 MCG/ACT nasal spray Place 2 sprays into both nostrils daily. 16 g 6   folic acid (FOLVITE) 1 MG tablet Take by mouth.     loratadine (CLARITIN) 10 MG tablet Take 10 mg by mouth daily.     methylphenidate (RITALIN) 10 MG tablet 1.5 tablets in the am, 1.5 tablets at noon, and 1 tablet in the evening. 124 tablet 0   methylphenidate (RITALIN) 10  MG tablet 1.5 tablets in the am, 1.5 tablets at noon, and 1 tablet in the evening 124 tablet 0   methylphenidate (RITALIN) 10 MG tablet 1.5 tablets in the morning, 1.5 tablets at noon and 1 tablet in the evening. 124 tablet 0   Multiple Vitamins-Minerals (MULTIVITAMIN GUMMIES ADULTS) CHEW Chew by mouth 2 (two) times daily.     pantoprazole (PROTONIX) 20 MG tablet Take 1 tablet (20 mg total) by mouth 2 (two) times daily before a meal. 1 hour to 30 minutes prior to breakfast and before dinner 60 tablet 2   sulfaSALAzine (AZULFIDINE) 500 MG EC tablet 1 Tab Daily for 7 days, 1 tab twice daily for 7 day, 2 tabs twice daily with food     No current facility-administered medications on file prior to visit.    Social History   Tobacco Use   Smoking status: Former    Types: Cigarettes    Quit date: 09/01/2001    Years since quitting: 21.0   Smokeless tobacco: Never  Substance Use Topics   Alcohol use: Yes    Alcohol/week: 3.0 - 4.0 standard drinks of alcohol    Types: 3 - 4 Standard drinks or equivalent per  week    Comment: both beer and liquor   Drug use: Never    Comment: weed. extacy.    Review of Systems  Constitutional:  Negative for chills and fever.  Respiratory:  Negative for cough.   Cardiovascular:  Negative for chest pain and palpitations.  Gastrointestinal:  Negative for nausea and vomiting.  Musculoskeletal:  Positive for back pain.      Objective:    BP 118/68 (BP Location: Left Arm, Patient Position: Sitting, Cuff Size: Normal)   Pulse 75   Temp 97.9 F (36.6 C) (Oral)   Ht 6' (1.829 m)   Wt 189 lb 14.4 oz (86.1 kg)   SpO2 97%   BMI 25.76 kg/m  BP Readings from Last 3 Encounters:  09/12/22 118/68  05/16/22 110/72  03/08/22 120/84   Wt Readings from Last 3 Encounters:  09/12/22 189 lb 14.4 oz (86.1 kg)  05/16/22 188 lb 6.4 oz (85.5 kg)  01/28/22 185 lb 6.4 oz (84.1 kg)    Physical Exam Vitals reviewed.  Constitutional:      Appearance: He is  well-developed.  Cardiovascular:     Rate and Rhythm: Regular rhythm.     Heart sounds: Normal heart sounds.  Pulmonary:     Effort: Pulmonary effort is normal. No respiratory distress.     Breath sounds: Normal breath sounds. No wheezing, rhonchi or rales.  Skin:    General: Skin is warm and dry.  Neurological:     Mental Status: He is alert.  Psychiatric:        Speech: Speech normal.        Behavior: Behavior normal.        Assessment & Plan:   Problem List Items Addressed This Visit       Other   Adult attention deficit disorder    Chronic, stable.  Continue Ritalin 10 mg as prescribed.      Back pain    Chronic, stable.  Currently following with rheumatology and pursuing MRI lumbar spine.  Will follow      Relevant Medications   celecoxib (CELEBREX) 100 MG capsule     I am having Tyler Hobbs maintain his Multivitamin Gummies Adults, fluticasone, loratadine, albuterol, pantoprazole, methylphenidate, methylphenidate, methylphenidate, celecoxib, sulfaSALAzine, and folic acid.   No orders of the defined types were placed in this encounter.   Return precautions given.   Risks, benefits, and alternatives of the medications and treatment plan prescribed today were discussed, and patient expressed understanding.   Education regarding symptom management and diagnosis given to patient on AVS.  Continue to follow with Burnard Hawthorne, FNP for routine health maintenance.   Tyler Hobbs and I agreed with plan.   Mable Paris, FNP

## 2022-10-18 DIAGNOSIS — G4733 Obstructive sleep apnea (adult) (pediatric): Secondary | ICD-10-CM | POA: Diagnosis not present

## 2022-10-23 ENCOUNTER — Other Ambulatory Visit: Payer: Self-pay | Admitting: Family

## 2022-10-23 DIAGNOSIS — R1013 Epigastric pain: Secondary | ICD-10-CM

## 2022-10-26 DIAGNOSIS — M45A0 Non-radiographic axial spondyloarthritis of unspecified sites in spine: Secondary | ICD-10-CM | POA: Diagnosis not present

## 2022-10-26 DIAGNOSIS — Z7960 Long term (current) use of unspecified immunomodulators and immunosuppressants: Secondary | ICD-10-CM | POA: Diagnosis not present

## 2022-11-18 DIAGNOSIS — G4733 Obstructive sleep apnea (adult) (pediatric): Secondary | ICD-10-CM | POA: Diagnosis not present

## 2022-11-23 ENCOUNTER — Other Ambulatory Visit: Payer: Self-pay | Admitting: Family

## 2022-11-23 DIAGNOSIS — F988 Other specified behavioral and emotional disorders with onset usually occurring in childhood and adolescence: Secondary | ICD-10-CM

## 2022-11-25 ENCOUNTER — Other Ambulatory Visit: Payer: Self-pay | Admitting: Family

## 2022-11-25 DIAGNOSIS — F988 Other specified behavioral and emotional disorders with onset usually occurring in childhood and adolescence: Secondary | ICD-10-CM

## 2022-11-25 MED ORDER — METHYLPHENIDATE HCL 10 MG PO TABS: ORAL_TABLET | ORAL | 0 refills | Status: DC

## 2022-11-25 NOTE — Progress Notes (Signed)
I looked up patient on [place] Controlled Substances Reporting System PMP AWARE and saw no activity that raised concern of inappropriate use.   

## 2022-12-06 ENCOUNTER — Encounter: Payer: Self-pay | Admitting: Family

## 2022-12-06 ENCOUNTER — Ambulatory Visit: Payer: BC Managed Care – PPO | Admitting: Family

## 2022-12-06 VITALS — BP 116/70 | HR 91 | Temp 98.6°F | Wt 199.4 lb

## 2022-12-06 DIAGNOSIS — Z8639 Personal history of other endocrine, nutritional and metabolic disease: Secondary | ICD-10-CM

## 2022-12-06 DIAGNOSIS — F988 Other specified behavioral and emotional disorders with onset usually occurring in childhood and adolescence: Secondary | ICD-10-CM | POA: Diagnosis not present

## 2022-12-06 DIAGNOSIS — Z13220 Encounter for screening for lipoid disorders: Secondary | ICD-10-CM

## 2022-12-06 DIAGNOSIS — Z125 Encounter for screening for malignant neoplasm of prostate: Secondary | ICD-10-CM

## 2022-12-06 DIAGNOSIS — Z136 Encounter for screening for cardiovascular disorders: Secondary | ICD-10-CM

## 2022-12-06 DIAGNOSIS — R1013 Epigastric pain: Secondary | ICD-10-CM | POA: Diagnosis not present

## 2022-12-06 DIAGNOSIS — K219 Gastro-esophageal reflux disease without esophagitis: Secondary | ICD-10-CM

## 2022-12-06 MED ORDER — PANTOPRAZOLE SODIUM 20 MG PO TBEC: DELAYED_RELEASE_TABLET | ORAL | 3 refills | Status: DC

## 2022-12-06 NOTE — Assessment & Plan Note (Signed)
Chronic, stable.  Continue Ritalin 15 mg qam, 15mg  at noon and 10mg  in the afternoon.

## 2022-12-06 NOTE — Progress Notes (Signed)
Assessment & Plan:  Screening for prostate cancer -     PSA  Epigastric pain -     Pantoprazole Sodium; Take 20-40mg  PO one hour prior to breakfast prn as needed.  Dispense: 180 tablet; Refill: 3  Adult attention deficit disorder Assessment & Plan: Chronic, stable.  Continue Ritalin 15 mg qam, 15mg  at noon and 10mg  in the afternoon.    Encounter for lipid screening for cardiovascular disease -     Lipid panel  History of vitamin D deficiency -     VITAMIN D 25 Hydroxy (Vit-D Deficiency, Fractures)  Gastroesophageal reflux disease, unspecified whether esophagitis present Assessment & Plan: Chronic, stable.  Continue Protonix 40 mg every morning.  Counseled patient on reducing dose to 20 mg if he is able and increasing to 40 mg for flares to reduce long-term effects of PPIs.        Return precautions given.   Risks, benefits, and alternatives of the medications and treatment plan prescribed today were discussed, and patient expressed understanding.   Education regarding symptom management and diagnosis given to patient on AVS either electronically or printed.  Return in about 3 months (around 03/07/2023) for Follow Up Chronic Management.  , FNP  Subjective:    Patient ID: Tyler Hobbs, male    DOB: 12-24-1981, 41 y.o.   MRN: 04/30/1981  CC: Tyler Hobbs is a 41 y.o. male who presents today for follow up.   HPI: Feels well today.  No new complaints.  Doing well on Ritalin regimen which remains helpful for concentration.  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  He is compliant with sulfasalazine which has been helpful. He is reducing celebrex dosing   GERD-compliant with Protonix 40 mg every morning which is helpful for symptoms.      Allergies: Patient has no known allergies. Current Outpatient Medications on File Prior to Visit  Medication Sig Dispense Refill   albuterol (VENTOLIN HFA) 108 (90 Base)  MCG/ACT inhaler Inhale 2 puffs into the lungs every 6 (six) hours as needed for wheezing or shortness of breath. 8 g 2   celecoxib (CELEBREX) 100 MG capsule Take 100 mg by mouth 2 (two) times daily.     fluticasone (FLONASE) 50 MCG/ACT nasal spray Place 2 sprays into both nostrils daily. 16 g 6   folic acid (FOLVITE) 1 MG tablet Take by mouth.     loratadine (CLARITIN) 10 MG tablet Take 10 mg by mouth daily.     methylphenidate (RITALIN) 10 MG tablet 1.5 tablets in the am, 1.5 tablets at noon, and 1 tablet in the evening 124 tablet 0   methylphenidate (RITALIN) 10 MG tablet 1.5 tablets in the morning, 1.5 tablets at noon and 1 tablet in the evening. 124 tablet 0   methylphenidate (RITALIN) 10 MG tablet 1.5 tablets in the am, 1.5 tablets at noon, and 1 tablet in the evening. 124 tablet 0   Multiple Vitamins-Minerals (MULTIVITAMIN GUMMIES ADULTS) CHEW Chew by mouth 2 (two) times daily.     sulfaSALAzine (AZULFIDINE) 500 MG EC tablet 1 Tab Daily for 7 days, 1 tab twice daily for 7 day, 2 tabs twice daily with food     No current facility-administered medications on file prior to visit.    Review of Systems  Constitutional:  Negative for chills and fever.  Respiratory:  Negative for cough.   Cardiovascular:  Negative for chest pain and palpitations.  Gastrointestinal:  Negative for nausea and  vomiting.      Objective:    BP 116/70   Pulse 91   Temp 98.6 F (37 C) (Oral)   Wt 199 lb 6.4 oz (90.4 kg)   SpO2 99%   BMI 27.04 kg/m  BP Readings from Last 3 Encounters:  12/06/22 116/70  09/12/22 118/68  05/16/22 110/72   Wt Readings from Last 3 Encounters:  12/06/22 199 lb 6.4 oz (90.4 kg)  09/12/22 189 lb 14.4 oz (86.1 kg)  05/16/22 188 lb 6.4 oz (85.5 kg)    Physical Exam Vitals reviewed.  Constitutional:      Appearance: He is well-developed.  Cardiovascular:     Rate and Rhythm: Regular rhythm.     Heart sounds: Normal heart sounds.  Pulmonary:     Effort: Pulmonary effort  is normal. No respiratory distress.     Breath sounds: Normal breath sounds. No wheezing, rhonchi or rales.  Skin:    General: Skin is warm and dry.  Neurological:     Mental Status: He is alert.  Psychiatric:        Speech: Speech normal.        Behavior: Behavior normal.

## 2022-12-06 NOTE — Assessment & Plan Note (Signed)
Chronic, stable.  Continue Protonix 40 mg every morning.  Counseled patient on reducing dose to 20 mg if he is able and increasing to 40 mg for flares to reduce long-term effects of PPIs.

## 2022-12-18 DIAGNOSIS — G4733 Obstructive sleep apnea (adult) (pediatric): Secondary | ICD-10-CM | POA: Diagnosis not present

## 2023-01-06 ENCOUNTER — Other Ambulatory Visit: Payer: BC Managed Care – PPO

## 2023-01-06 DIAGNOSIS — Z8639 Personal history of other endocrine, nutritional and metabolic disease: Secondary | ICD-10-CM

## 2023-01-06 DIAGNOSIS — Z13220 Encounter for screening for lipoid disorders: Secondary | ICD-10-CM

## 2023-01-06 DIAGNOSIS — Z136 Encounter for screening for cardiovascular disorders: Secondary | ICD-10-CM

## 2023-01-06 DIAGNOSIS — Z125 Encounter for screening for malignant neoplasm of prostate: Secondary | ICD-10-CM

## 2023-01-07 LAB — LIPID PANEL
Chol/HDL Ratio: 3.6 ratio (ref 0.0–5.0)
Cholesterol, Total: 158 mg/dL (ref 100–199)
HDL: 44 mg/dL (ref >39)
LDL Chol Calc (NIH): 85 mg/dL (ref 0–99)
Triglycerides: 170 mg/dL — ABNORMAL HIGH (ref 0–149)
VLDL Cholesterol Cal: 29 mg/dL (ref 5–40)

## 2023-01-07 LAB — VITAMIN D 25 HYDROXY (VIT D DEFICIENCY, FRACTURES): Vit D, 25-Hydroxy: 19.8 ng/mL — ABNORMAL LOW (ref 30.0–100.0)

## 2023-01-07 LAB — PSA: Prostate Specific Ag, Serum: 0.9 ng/mL (ref 0.0–4.0)

## 2023-01-18 ENCOUNTER — Encounter: Payer: Self-pay | Admitting: Adult Health

## 2023-01-18 ENCOUNTER — Ambulatory Visit (INDEPENDENT_AMBULATORY_CARE_PROVIDER_SITE_OTHER): Payer: Self-pay | Admitting: Adult Health

## 2023-01-18 VITALS — BP 126/82 | HR 66 | Temp 97.6°F

## 2023-01-18 DIAGNOSIS — H6690 Otitis media, unspecified, unspecified ear: Secondary | ICD-10-CM

## 2023-01-18 MED ORDER — AMOXICILLIN-POT CLAVULANATE 875-125 MG PO TABS: 1.0000 | ORAL_TABLET | Freq: Two times a day (BID) | ORAL | 0 refills | Status: DC

## 2023-01-18 NOTE — Progress Notes (Signed)
[facility] [address], [place] [address]   Office Visit Note  Patient Name: Tyler Hobbs Date of Birth [id number]  Medical Record number [id number]  Date of Service: 01/18/2023  Chief Complaint  Patient presents with   Ear Pain    Left ear pain started Mon. Got OTC ear drops not working. Sinus pressure. No fever, ST or BA     HPI Pt is here for a sick visit. He reports ear ache that started 2 days ago. He tried over the counter drops, with no success.  He describes it as an ache, but intermittently has sharp pain. Patient has history of frequent Ear infections since childhood.  Has been about 1 year since last one.    Current Medication:  Outpatient Encounter Medications as of 01/18/2023  Medication Sig   amoxicillin-clavulanate (AUGMENTIN) 875-125 MG tablet Take 1 tablet by mouth 2 (two) times daily.   celecoxib (CELEBREX) 100 MG capsule Take 100 mg by mouth 2 (two) times daily.   fluticasone (FLONASE) 50 MCG/ACT nasal spray Place 2 sprays into both nostrils daily.   folic acid (FOLVITE) 1 MG tablet Take by mouth.   loratadine (CLARITIN) 10 MG tablet Take 10 mg by mouth daily.   methylphenidate (RITALIN) 10 MG tablet 1.5 tablets in the am, 1.5 tablets at noon, and 1 tablet in the evening   methylphenidate (RITALIN) 10 MG tablet 1.5 tablets in the morning, 1.5 tablets at noon and 1 tablet in the evening.   methylphenidate (RITALIN) 10 MG tablet 1.5 tablets in the am, 1.5 tablets at noon, and 1 tablet in the evening.   Multiple Vitamins-Minerals (MULTIVITAMIN GUMMIES ADULTS) CHEW Chew by mouth 2 (two) times daily.   pantoprazole (PROTONIX) 20 MG tablet Take 20-40mg  PO one hour prior to breakfast prn as needed.   sulfaSALAzine (AZULFIDINE) 500 MG EC tablet 1 Tab Daily for 7 days, 1 tab twice daily for 7 day, 2 tabs twice daily with food   albuterol (VENTOLIN HFA) 108 (90 Base) MCG/ACT inhaler Inhale 2 puffs into the lungs every 6 (six) hours as needed for  wheezing or shortness of breath.   No facility-administered encounter medications on file as of 01/18/2023.      Medical History: Past Medical History:  Diagnosis Date   Asthma    Chicken pox    Hay fever    Multiple allergies    OSA (obstructive sleep apnea)      Vital Signs: BP 126/82 (BP Location: Left Arm, Patient Position: Sitting, Cuff Size: Normal)   Pulse 66   Temp 97.6 F (36.4 C) (Tympanic)   SpO2 99%    Review of Systems  Constitutional:  Negative for fatigue and fever.    Physical Exam Vitals and nursing note reviewed.  Constitutional:      Appearance: Normal appearance.  HENT:     Right Ear: Tympanic membrane and ear canal normal.     Ears:     Comments: Mildly bulging left TM.  No redness or injection. Canal clear    Nose: Nose normal.     Mouth/Throat:     Mouth: Mucous membranes are moist.  Eyes:     Pupils: Pupils are equal, round, and reactive to light.  Pulmonary:     Effort: Pulmonary effort is normal.     Breath sounds: Normal breath sounds.  Lymphadenopathy:     Cervical: No cervical adenopathy.  Neurological:     Mental Status: He is alert.  Assessment/Plan: 1. Acute otitis media, unspecified otitis media type Given history of ear infections, and patients current symptoms, encouraged him to take sudafed or other decongestant.  Use Flonase 2 sprays each nostril twice daily. If symptoms persist, Take complete course of antibiotics as prescribed.  Take with food.  If symptoms fail to improve, or new/worse symptoms develop follow up in clinic.   - amoxicillin-clavulanate (AUGMENTIN) 875-125 MG tablet; Take 1 tablet by mouth 2 (two) times daily.  Dispense: 20 tablet; Refill: 0        General Counseling: Tyler Hobbs understanding of the findings of todays visit and agrees with plan of treatment. I have discussed any further diagnostic evaluation that may be needed or ordered today. We also reviewed his medications today. he has  been encouraged to call the office with any questions or concerns that should arise related to todays visit.   No orders of the defined types were placed in this encounter.   Meds ordered this encounter  Medications   amoxicillin-clavulanate (AUGMENTIN) 875-125 MG tablet    Sig: Take 1 tablet by mouth 2 (two) times daily.    Dispense:  20 tablet    Refill:  0    Time spent:15 Minutes    Kendell Bane AGNP-C Nurse Practitioner

## 2023-01-26 DIAGNOSIS — Z7960 Long term (current) use of unspecified immunomodulators and immunosuppressants: Secondary | ICD-10-CM | POA: Diagnosis not present

## 2023-01-26 DIAGNOSIS — M45A0 Non-radiographic axial spondyloarthritis of unspecified sites in spine: Secondary | ICD-10-CM | POA: Diagnosis not present

## 2023-01-26 DIAGNOSIS — M461 Sacroiliitis, not elsewhere classified: Secondary | ICD-10-CM | POA: Diagnosis not present

## 2023-02-21 ENCOUNTER — Encounter: Payer: Self-pay | Admitting: Physician Assistant

## 2023-02-21 ENCOUNTER — Ambulatory Visit (INDEPENDENT_AMBULATORY_CARE_PROVIDER_SITE_OTHER): Payer: Self-pay | Admitting: Physician Assistant

## 2023-02-21 VITALS — BP 140/90 | HR 67 | Temp 98.0°F

## 2023-02-21 DIAGNOSIS — S39012A Strain of muscle, fascia and tendon of lower back, initial encounter: Secondary | ICD-10-CM

## 2023-02-21 MED ORDER — CYCLOBENZAPRINE HCL 5 MG PO TABS: 5.0000 mg | ORAL_TABLET | Freq: Every day | ORAL | 0 refills | Status: DC

## 2023-02-21 NOTE — Progress Notes (Addendum)
[facility] [address], [place] [address]   Office Visit Note  Patient Name: Tyler Hobbs Date of Birth [id number]  Medical Record number [id number]  Date of Service: 02/21/2023  Chief Complaint  Patient presents with   Back Pain    Moving stuff around over the weekend and hurt his left hip. Using heating pad. Today is a little better. Ice makes it hurt more.      42 y/o M presents to the clinic for c/o left lower back/hip pain x 3 days. Pt states he was moving a mattress and an elliptical machine over the weekend. Noticed pain later that night. Denies fall or direct trauma. Denies radicular symptoms down left lower leg.  Worse pain with change of position. Denies changes in bowel or urinary incontinence. +h/o R hip pain and follows up with Rheumatologist. Since his left hip/lower back pain his R hip pain has "not been an issue". Has been taking Sulfasalazine, Zinc, and Celebrex.  Back Pain      Current Medication:  Outpatient Encounter Medications as of 02/21/2023  Medication Sig   albuterol (VENTOLIN HFA) 108 (90 Base) MCG/ACT inhaler Inhale 2 puffs into the lungs every 6 (six) hours as needed for wheezing or shortness of breath.   celecoxib (CELEBREX) 100 MG capsule Take 100 mg by mouth 2 (two) times daily.   cyclobenzaprine (FLEXERIL) 5 MG tablet Take 1 tablet (5 mg total) by mouth at bedtime.   fluticasone (FLONASE) 50 MCG/ACT nasal spray Place 2 sprays into both nostrils daily.   folic acid (FOLVITE) 1 MG tablet Take by mouth.   loratadine (CLARITIN) 10 MG tablet Take 10 mg by mouth daily.   methylphenidate (RITALIN) 10 MG tablet 1.5 tablets in the am, 1.5 tablets at noon, and 1 tablet in the evening   methylphenidate (RITALIN) 10 MG tablet 1.5 tablets in the morning, 1.5 tablets at noon and 1 tablet in the evening.   methylphenidate (RITALIN) 10 MG tablet 1.5 tablets in the am, 1.5 tablets at noon, and 1 tablet in the evening.   Multiple  Vitamins-Minerals (MULTIVITAMIN GUMMIES ADULTS) CHEW Chew by mouth 2 (two) times daily.   pantoprazole (PROTONIX) 20 MG tablet Take 20-'40mg'$  PO one hour prior to breakfast prn as needed.   sulfaSALAzine (AZULFIDINE) 500 MG EC tablet 1 Tab Daily for 7 days, 1 tab twice daily for 7 day, 2 tabs twice daily with food   amoxicillin-clavulanate (AUGMENTIN) 875-125 MG tablet Take 1 tablet by mouth 2 (two) times daily. (Patient not taking: Reported on 02/21/2023)   No facility-administered encounter medications on file as of 02/21/2023.      Medical History: Past Medical History:  Diagnosis Date   Asthma    Chicken pox    Hay fever    Multiple allergies    OSA (obstructive sleep apnea)      Vital Signs: BP (!) 140/90 (BP Location: Left Arm, Patient Position: Sitting, Cuff Size: Normal)   Pulse 67   Temp 98 F (36.7 C) (Tympanic)   SpO2 97%    Review of Systems  Constitutional: Negative.   Musculoskeletal:  Positive for back pain. Negative for gait problem, neck pain and neck stiffness.  Skin: Negative.     Physical Exam Constitutional:      Appearance: Normal appearance.  HENT:     Head: Normocephalic and atraumatic.     Right Ear: External ear normal.     Left Ear: External ear normal.  Eyes:  Extraocular Movements: Extraocular movements intact.  Musculoskeletal:     Lumbar back: Tenderness present. No swelling. Decreased range of motion. Negative right straight leg raise test and negative left straight leg raise test.     Comments: +pain with trunk rotation and lateral bends.  Slightly limited forward flexion, bends 2 inches from toes.  Equivocal Faber test on left and right side   Skin:    General: Skin is warm and dry.  Neurological:     Mental Status: He is alert and oriented to person, place, and time.  Psychiatric:        Mood and Affect: Mood normal.        Behavior: Behavior normal.       Assessment/Plan:  1. Lumbar strain, initial encounter -  cyclobenzaprine (FLEXERIL) 5 MG tablet; Take 1 tablet (5 mg total) by mouth at bedtime.  Dispense: 10 tablet; Refill: 0  Reviewed my clinical findings with patient. Increase stretches and ROM exercises as tolerated. Continue with anti-inflammatory medicine as previously prescribed. Take muscle relaxer as prescribed at bedtime if needed. Apply moist heat. Massage otc cream ie Biofreeze as directed on the box. Patient received samples of Biofreeze gel at the clinic today.  Continue to watch for worsening symptoms. RTC in 7-10 days or sooner if any difficulty arises. Pt verbalized understanding and in agreement.   General Counseling: pranjal mayhew understanding of the findings of todays visit and agrees with plan of treatment. I have discussed any further diagnostic evaluation that may be needed or ordered today. We also reviewed his medications today. he has been encouraged to call the office with any questions or concerns that should arise related to todays visit.    Time spent:[address], [place] Physician Assistant

## 2023-02-21 NOTE — Addendum Note (Signed)
Addended by: Johnna Acosta on: 02/21/2023 02:33 PM   Modules accepted: Orders

## 2023-02-27 ENCOUNTER — Other Ambulatory Visit: Payer: Self-pay | Admitting: Family

## 2023-02-27 DIAGNOSIS — F988 Other specified behavioral and emotional disorders with onset usually occurring in childhood and adolescence: Secondary | ICD-10-CM

## 2023-02-28 ENCOUNTER — Other Ambulatory Visit: Payer: Self-pay

## 2023-02-28 ENCOUNTER — Other Ambulatory Visit: Payer: Self-pay | Admitting: Family

## 2023-02-28 ENCOUNTER — Encounter: Payer: Self-pay | Admitting: Family

## 2023-02-28 DIAGNOSIS — F988 Other specified behavioral and emotional disorders with onset usually occurring in childhood and adolescence: Secondary | ICD-10-CM

## 2023-02-28 MED ORDER — METHYLPHENIDATE HCL 10 MG PO TABS: ORAL_TABLET | ORAL | 0 refills | Status: DC

## 2023-02-28 NOTE — Telephone Encounter (Signed)
Rx sent to [facility] for approval

## 2023-03-03 ENCOUNTER — Encounter: Payer: Self-pay | Admitting: Family

## 2023-03-06 ENCOUNTER — Telehealth: Payer: Self-pay

## 2023-03-06 ENCOUNTER — Other Ambulatory Visit (HOSPITAL_COMMUNITY): Payer: Self-pay

## 2023-03-06 NOTE — Telephone Encounter (Signed)
Pharmacy Patient Advocate Encounter   Received notification that prior authorization for Methylphenidate HCl '10MG'$  tablets is required/requested.  Per Test Claim: Max 3 tablets per day   PA submitted on 03/06/23 to (ins) [employer] via [employer] or [facility]) confirmation # [id number]  Status is pending

## 2023-03-09 ENCOUNTER — Other Ambulatory Visit (HOSPITAL_COMMUNITY): Payer: Self-pay

## 2023-03-09 NOTE — Telephone Encounter (Signed)
Patient Advocate Encounter  Prior Authorization for Methylphenidate HCl '10MG'$  tablets  has been approved.    PA# [id number] Effective dates: 03/06/23 through 06/04/23

## 2023-03-09 NOTE — Telephone Encounter (Signed)
Spoke to pt and informed him of PA approval. Pt verbalized understanding

## 2023-03-28 ENCOUNTER — Other Ambulatory Visit: Payer: Self-pay | Admitting: Rheumatology

## 2023-03-28 DIAGNOSIS — M45A0 Non-radiographic axial spondyloarthritis of unspecified sites in spine: Secondary | ICD-10-CM

## 2023-03-31 ENCOUNTER — Other Ambulatory Visit: Payer: BC Managed Care – PPO

## 2023-04-28 ENCOUNTER — Other Ambulatory Visit: Payer: Self-pay | Admitting: Rheumatology

## 2023-04-28 ENCOUNTER — Ambulatory Visit
Admission: RE | Admit: 2023-04-28 | Discharge: 2023-04-28 | Disposition: A | Discharge status: Home / Self Care | Payer: BC Managed Care – PPO | Source: Ambulatory Visit | Attending: Rheumatology | Admitting: Rheumatology

## 2023-04-28 DIAGNOSIS — G8929 Other chronic pain: Secondary | ICD-10-CM

## 2023-04-28 DIAGNOSIS — M461 Sacroiliitis, not elsewhere classified: Secondary | ICD-10-CM | POA: Diagnosis not present

## 2023-05-03 ENCOUNTER — Encounter: Payer: Self-pay | Admitting: Rheumatology

## 2023-05-06 ENCOUNTER — Ambulatory Visit
Admission: RE | Admit: 2023-05-06 | Discharge: 2023-05-06 | Disposition: A | Discharge status: Home / Self Care | Payer: BC Managed Care – PPO | Source: Ambulatory Visit | Attending: Rheumatology | Admitting: Rheumatology

## 2023-05-06 DIAGNOSIS — M45A0 Non-radiographic axial spondyloarthritis of unspecified sites in spine: Secondary | ICD-10-CM

## 2023-05-06 DIAGNOSIS — M25551 Pain in right hip: Secondary | ICD-10-CM | POA: Diagnosis not present

## 2023-05-06 DIAGNOSIS — M545 Low back pain, unspecified: Secondary | ICD-10-CM | POA: Diagnosis not present

## 2023-05-19 ENCOUNTER — Ambulatory Visit: Payer: BC Managed Care – PPO | Admitting: Family

## 2023-05-30 ENCOUNTER — Ambulatory Visit: Payer: BC Managed Care – PPO | Admitting: Family

## 2023-05-30 ENCOUNTER — Ambulatory Visit (INDEPENDENT_AMBULATORY_CARE_PROVIDER_SITE_OTHER): Payer: BC Managed Care – PPO

## 2023-05-30 ENCOUNTER — Encounter: Payer: Self-pay | Admitting: Family

## 2023-05-30 VITALS — BP 134/76 | HR 80 | Temp 97.9°F | Ht 72.0 in | Wt 200.0 lb

## 2023-05-30 DIAGNOSIS — M25522 Pain in left elbow: Secondary | ICD-10-CM | POA: Diagnosis not present

## 2023-05-30 DIAGNOSIS — F988 Other specified behavioral and emotional disorders with onset usually occurring in childhood and adolescence: Secondary | ICD-10-CM | POA: Diagnosis not present

## 2023-05-30 MED ORDER — METHYLPHENIDATE HCL 10 MG PO TABS
ORAL_TABLET | ORAL | 0 refills | Status: DC
Start: 2023-05-30 — End: 2023-08-25

## 2023-05-30 MED ORDER — METHYLPHENIDATE HCL 10 MG PO TABS
ORAL_TABLET | ORAL | 0 refills | Status: DC
Start: 2023-05-30 — End: 2023-09-19

## 2023-05-30 MED ORDER — METHYLPHENIDATE HCL 10 MG PO TABS: ORAL_TABLET | ORAL | 0 refills | Status: DC

## 2023-05-30 NOTE — Patient Instructions (Signed)
Continue Celebrex as prescribed Dr. Allena Katz.  Icing 20 minutes twice per day.  You may purchase a neoprene sleeve for your elbow for additional support.  Avoid activities that increase pain.    Distal Biceps Tendinitis Rehab Ask your health care provider which exercises are safe for you. Do exercises exactly as told by your health care provider and adjust them as directed. It is normal to feel mild stretching, pulling, tightness, or discomfort as you do these exercises. Stop right away if you feel sudden pain or your pain gets worse. Do not begin these exercises until told by your health care provider. Stretching and range-of-motion exercises These exercises warm up your muscles and joints. They can help improve the movement and flexibility of your arm. They may also help to relieve pain and stiffness. Elbow range of motion  Stand or sit with your left / right elbow bent in a 90-degree angle (right angle). Position your forearm so that the thumb is facing the ceiling (neutral position). Slowly straighten your elbow until you feel a stretch. Hold this position for __________ seconds. Slowly bend your elbow until you feel a stretch, or until you touch your thumb to your shoulder. Hold this position for __________ seconds. Repeat __________ times. Complete this exercise __________ times a day. Forearm rotation, supination  Stand up or sit with your left / right elbow bent in a 90-degree angle (right angle). Rotate your palm up until you cannot rotate it anymore (supination). Then, use your other hand to help turn your left / right forearm more. Hold this position for __________ seconds. Slowly return to the starting position. Repeat __________ times. Complete this exercise __________ times a day. Forearm rotation, pronation  Stand up or sit with your left / right elbow bent in a 90-degree angle (right angle). Turn (rotate) your left / right palm down (pronation) until you cannot rotate it  anymore. Then, use your other hand to help turn your left / right forearm more. Hold this position for __________ seconds. Slowly return to the starting position. Repeat __________ times. Complete this exercise __________ times a day. Biceps stretch  Stand by a door frame. Place your left / right hand on the door frame. Keep your elbow straight during the exercise. Gently turn your body toward the opposite side until you feel a gentle stretch in the front of the elbow or upper arm. Hold this position for __________ seconds. Slowly return to the starting position. Repeat __________ times. Complete this exercise __________ times a day. Strengthening exercises These exercises build strength and endurance in your arm and shoulder. Endurance is the ability to use your muscles for a long time, even after they get tired. Forearm rotation, supination  Sit with your left / right forearm supported on a table. Your elbow should be at waist height. Gently grasp a lightweight hammer near the head. As this exercise gets easier for you, try holding the hammer farther down the handle. Rest your hand over the edge of the table, palm down. Without moving your left / right elbow, slowly rotate your palm up (supination), stopping when your thumb is pointed to the opposite direction. Hold this position for__________ seconds. Slowly return to the starting position. Repeat __________ times. Complete this exercise __________ times a day. Forearm rotation, pronation  Sit with your left / right forearm supported on a table. Your elbow should be at waist height. Gently grasp a lightweight hammer near the head. As this exercise gets easier for you, try  holding the hammer farther down the handle. Rest your hand over the edge of the table, palm up. Without moving your left / right elbow, slowly rotate your palm down (pronation), stopping when your thumb is pointed to the opposite direction. Hold this position for  __________ seconds. Slowly return to the starting position. Repeat __________ times. Complete this exercise __________ times a day. Biceps curls  Sit on a chair without armrests, or stand up. If directed, hold a __________ lb / kg weight in your left / right hand, or hold an exercise band with both hands. Your palms should face up toward the ceiling at the starting position. Bend your left / right elbow. Move your hand up toward your shoulder. Keep your other arm straight down, in the starting position. Slowly return to the starting position. Repeat __________ times. Complete this exercise __________ times a day. Elbow extension, supine  Lie on your back (supine position). Hold a __________ lb / kg weight in your left / right hand. Bend your left / right elbow to a 90-degree angle (right angle) so the weight is in front of your face, over your chest. Your elbow should be pointing up to the ceiling. Straighten your elbow, raising your hand toward the ceiling (extension). Use your other hand to support your left / right upper arm and to keep it still. Slowly return to the starting position. Repeat __________ times. Complete this exercise __________ times a day. This information is not intended to replace advice given to you by your health care provider. Make sure you discuss any questions you have with your health care provider. Document Revised: 04/15/2022 Document Reviewed: 04/15/2022 Elsevier Patient Education  2024 [employer] Distal Biceps Tendinitis Rehab Ask your health care provider which exercises are safe for you. Do exercises exactly as told by your health care provider and adjust them as directed. It is normal to feel mild stretching, pulling, tightness, or discomfort as you do these exercises. Stop right away if you feel sudden pain or your pain gets worse. Do not begin these exercises until told by your health care provider. Stretching and range-of-motion exercises These  exercises warm up your muscles and joints. They can help improve the movement and flexibility of your arm. They may also help to relieve pain and stiffness. Elbow range of motion  Stand or sit with your left / right elbow bent in a 90-degree angle (right angle). Position your forearm so that the thumb is facing the ceiling (neutral position). Slowly straighten your elbow until you feel a stretch. Hold this position for __________ seconds. Slowly bend your elbow until you feel a stretch, or until you touch your thumb to your shoulder. Hold this position for __________ seconds. Repeat __________ times. Complete this exercise __________ times a day. Forearm rotation, supination  Stand up or sit with your left / right elbow bent in a 90-degree angle (right angle). Rotate your palm up until you cannot rotate it anymore (supination). Then, use your other hand to help turn your left / right forearm more. Hold this position for __________ seconds. Slowly return to the starting position. Repeat __________ times. Complete this exercise __________ times a day. Forearm rotation, pronation  Stand up or sit with your left / right elbow bent in a 90-degree angle (right angle). Turn (rotate) your left / right palm down (pronation) until you cannot rotate it anymore. Then, use your other hand to help turn your left / right forearm more. Hold this position for  __________ seconds. Slowly return to the starting position. Repeat __________ times. Complete this exercise __________ times a day. Biceps stretch  Stand by a door frame. Place your left / right hand on the door frame. Keep your elbow straight during the exercise. Gently turn your body toward the opposite side until you feel a gentle stretch in the front of the elbow or upper arm. Hold this position for __________ seconds. Slowly return to the starting position. Repeat __________ times. Complete this exercise __________ times a day. Strengthening  exercises These exercises build strength and endurance in your arm and shoulder. Endurance is the ability to use your muscles for a long time, even after they get tired. Forearm rotation, supination  Sit with your left / right forearm supported on a table. Your elbow should be at waist height. Gently grasp a lightweight hammer near the head. As this exercise gets easier for you, try holding the hammer farther down the handle. Rest your hand over the edge of the table, palm down. Without moving your left / right elbow, slowly rotate your palm up (supination), stopping when your thumb is pointed to the opposite direction. Hold this position for__________ seconds. Slowly return to the starting position. Repeat __________ times. Complete this exercise __________ times a day. Forearm rotation, pronation  Sit with your left / right forearm supported on a table. Your elbow should be at waist height. Gently grasp a lightweight hammer near the head. As this exercise gets easier for you, try holding the hammer farther down the handle. Rest your hand over the edge of the table, palm up. Without moving your left / right elbow, slowly rotate your palm down (pronation), stopping when your thumb is pointed to the opposite direction. Hold this position for __________ seconds. Slowly return to the starting position. Repeat __________ times. Complete this exercise __________ times a day. Biceps curls  Sit on a chair without armrests, or stand up. If directed, hold a __________ lb / kg weight in your left / right hand, or hold an exercise band with both hands. Your palms should face up toward the ceiling at the starting position. Bend your left / right elbow. Move your hand up toward your shoulder. Keep your other arm straight down, in the starting position. Slowly return to the starting position. Repeat __________ times. Complete this exercise __________ times a day. Elbow extension, supine  Lie on your  back (supine position). Hold a __________ lb / kg weight in your left / right hand. Bend your left / right elbow to a 90-degree angle (right angle) so the weight is in front of your face, over your chest. Your elbow should be pointing up to the ceiling. Straighten your elbow, raising your hand toward the ceiling (extension). Use your other hand to support your left / right upper arm and to keep it still. Slowly return to the starting position. Repeat __________ times. Complete this exercise __________ times a day. This information is not intended to replace advice given to you by your health care provider. Make sure you discuss any questions you have with your health care provider. Document Revised: 04/15/2022 Document Reviewed: 04/15/2022 Elsevier Patient Education  2024 [employer].

## 2023-05-30 NOTE — Assessment & Plan Note (Addendum)
Presentation c/w bicep tendinitis.  No known injury.  Pending x-ray to evaluate for degenerative changes.  Discussed ultrasound or MRI would be better test if pain persists.  Currently taking Celebrex 100 mg twice daily as prescribed by Dr. Allena Katz.  Encouraged icing regimen.  Exercises provided.  Consider physical therapy.

## 2023-05-30 NOTE — Progress Notes (Signed)
Assessment & Plan:  Adult attention deficit disorder Assessment & Plan: Chronic, stable.  Continue Ritalin 15 mg qam, 15mg  at noon and 10mg  in the afternoon.   Orders: -     Methylphenidate HCl; 1.5 tablets in the morning, 1.5 tablets at noon and 1 tablet in the evening.  Dispense: 120 tablet; Refill: 0 -     Methylphenidate HCl; 1.5 tablets in the am, 1.5 tablets at noon, and 1 tablet in the evening.  Dispense: 120 tablet; Refill: 0 -     Methylphenidate HCl; 1.5 tablets in the am, 1.5 tablets at noon, and 1 tablet in the evening  Dispense: 120 tablet; Refill: 0  Left elbow pain Assessment & Plan: Presentation c/w bicep tendinitis.  No known injury.  Pending x-ray to evaluate for degenerative changes.  Discussed ultrasound or MRI would be better test if pain persists.  Currently taking Celebrex 100 mg twice daily as prescribed by Dr. Allena Katz.  Encouraged icing regimen.  Exercises provided.  Consider physical therapy.   Orders: -     DG ELBOW COMPLETE LEFT (3+VIEW); Future     Return precautions given.   Risks, benefits, and alternatives of the medications and treatment plan prescribed today were discussed, and patient expressed understanding.   Education regarding symptom management and diagnosis given to patient on AVS either electronically or printed.  Return in about 3 months (around 08/30/2023).  Rennie Plowman, FNP  Subjective:    Patient ID: Tyler Hobbs, male    DOB: Jun 29, 1981, 42 y.o.   MRN: [id number]  CC: Tyler Hobbs is a 42 y.o. male who presents today for follow up.   HPI: HPI Complains of left elbow pain x 2 months,  Icing Pain with picking up such as a jug of sweet tea, otherwise no pain.   No fever, swelling, numbness, bruising  He is ambidextrous  usimg mouse for computer in right hand.   No injury, or sudden pop.  No neck or left shoulder pain.    Taking celebrex 100mg  bid and sulfasalazine 1000mg  BID.   Verbal consent for services obtained  from patient prior to services given to TELEPHONE visit:   Location of call:  provider at work patient at home  Names of all persons present for services: Rennie Plowman, NP and patient    A/P/next steps:  Adult attention deficit disorder Assessment & Plan: Chronic, stable.  Continue Ritalin 15 mg qam, 15mg  at noon and 10mg  in the afternoon.   Orders: -     Methylphenidate HCl; 1.5 tablets in the morning, 1.5 tablets at noon and 1 tablet in the evening.  Dispense: 120 tablet; Refill: 0 -     Methylphenidate HCl; 1.5 tablets in the am, 1.5 tablets at noon, and 1 tablet in the evening.  Dispense: 120 tablet; Refill: 0 -     Methylphenidate HCl; 1.5 tablets in the am, 1.5 tablets at noon, and 1 tablet in the evening  Dispense: 120 tablet; Refill: 0  Left elbow pain Assessment & Plan: Presentation c/w bicep tendinitis.  No known injury.  Pending x-ray to evaluate for degenerative changes.  Discussed ultrasound or MRI would be better test if pain persists.  Currently taking Celebrex 100 mg twice daily as prescribed by Dr. Allena Katz.  Encouraged icing regimen.  Exercises provided.  Consider physical therapy.   Orders: -     DG ELBOW COMPLETE LEFT (3+VIEW); Future     Advised patient AVS could be mailed or viewed over MyChart if MyChart user.  I spent 15 min  discussing plan of care over the phone.          Allergies: Patient has no known allergies. Current Outpatient Medications on File Prior to Visit  Medication Sig Dispense Refill   albuterol (VENTOLIN HFA) 108 (90 Base) MCG/ACT inhaler Inhale 2 puffs into the lungs every 6 (six) hours as needed for wheezing or shortness of breath. 8 g 2   celecoxib (CELEBREX) 100 MG capsule Take 100 mg by mouth 2 (two) times daily.     cyclobenzaprine (FLEXERIL) 5 MG tablet Take 1 tablet (5 mg total) by mouth at bedtime. 10 tablet 0   fluticasone (FLONASE) 50 MCG/ACT nasal spray Place 2 sprays into both nostrils daily. 16 g 6   folic acid  (FOLVITE) 1 MG tablet Take by mouth.     loratadine (CLARITIN) 10 MG tablet Take 10 mg by mouth daily.     Multiple Vitamins-Minerals (MULTIVITAMIN GUMMIES ADULTS) CHEW Chew by mouth 2 (two) times daily.     pantoprazole (PROTONIX) 20 MG tablet Take 20-40mg  PO one hour prior to breakfast prn as needed. 180 tablet 3   sulfaSALAzine (AZULFIDINE) 500 MG EC tablet 1 Tab Daily for 7 days, 1 tab twice daily for 7 day, 2 tabs twice daily with food     No current facility-administered medications on file prior to visit.    Review of Systems    Objective:    BP 134/76   Pulse 80   Temp 97.9 F (36.6 C) (Oral)   Ht 6' (1.829 m)   Wt 200 lb (90.7 kg)   SpO2 96%   BMI 27.12 kg/m  BP Readings from Last 3 Encounters:  05/30/23 134/76  02/21/23 (!) 140/90  01/18/23 126/82   Wt Readings from Last 3 Encounters:  05/30/23 200 lb (90.7 kg)  12/06/22 199 lb 6.4 oz (90.4 kg)  09/12/22 189 lb 14.4 oz (86.1 kg)    Physical Exam Vitals reviewed.  Constitutional:      Appearance: He is well-developed.  Cardiovascular:     Rate and Rhythm: Regular rhythm.     Heart sounds: Normal heart sounds.  Pulmonary:     Effort: Pulmonary effort is normal. No respiratory distress.     Breath sounds: Normal breath sounds. No wheezing, rhonchi or rales.  Musculoskeletal:     Comments:  No pain elicited with  Full ROM with flexion, extension, supination, and pronation.  Strength 5/5. Pain elicited with resisted extension at proximal lateral epicondyle. Negative Yergason's test.  No ecchymosis or swelling. Sensation intact.  Grip strength equal Palpable radial pulses bilaterally   Skin:    General: Skin is warm and dry.  Neurological:     Mental Status: He is alert.  Psychiatric:        Speech: Speech normal.        Behavior: Behavior normal.

## 2023-05-30 NOTE — Assessment & Plan Note (Signed)
Chronic, stable.  Continue Ritalin 15 mg qam, 15mg at noon and 10mg in the afternoon.  

## 2023-06-02 DIAGNOSIS — Z7960 Long term (current) use of unspecified immunomodulators and immunosuppressants: Secondary | ICD-10-CM | POA: Diagnosis not present

## 2023-06-02 DIAGNOSIS — M7712 Lateral epicondylitis, left elbow: Secondary | ICD-10-CM | POA: Diagnosis not present

## 2023-06-02 DIAGNOSIS — M45A0 Non-radiographic axial spondyloarthritis of unspecified sites in spine: Secondary | ICD-10-CM | POA: Diagnosis not present

## 2023-07-14 DIAGNOSIS — G4733 Obstructive sleep apnea (adult) (pediatric): Secondary | ICD-10-CM | POA: Diagnosis not present

## 2023-08-21 ENCOUNTER — Ambulatory Visit (INDEPENDENT_AMBULATORY_CARE_PROVIDER_SITE_OTHER): Payer: Self-pay | Admitting: Physician Assistant

## 2023-08-21 DIAGNOSIS — J9801 Acute bronchospasm: Secondary | ICD-10-CM

## 2023-08-21 DIAGNOSIS — U071 COVID-19: Secondary | ICD-10-CM

## 2023-08-21 MED ORDER — METHYLPREDNISOLONE 4 MG PO TBPK
ORAL_TABLET | ORAL | 0 refills | Status: DC
Start: 2023-08-21 — End: 2023-09-20

## 2023-08-21 MED ORDER — ALBUTEROL SULFATE HFA 108 (90 BASE) MCG/ACT IN AERS
2.0000 | INHALATION_SPRAY | Freq: Four times a day (QID) | RESPIRATORY_TRACT | 1 refills | Status: AC | PRN
Start: 2023-08-21 — End: ?

## 2023-08-21 NOTE — Progress Notes (Signed)
Virtual Visit Consent   Tyler Hobbs, you are scheduled for a virtual visit with a [facility] provider today. Just as with appointments in the office, your consent must be obtained to participate. Your consent will be active for this visit and any virtual visit you may have with one of our providers in the next 365 days. If you have a MyChart account, a copy of this consent can be sent to you electronically.  As this is a virtual telephone visit which doesn't allow your provider to perform a traditional examination and may limit your provider's ability to fully assess your condition. If your provider identifies any concerns that need to be evaluated in person or the need to arrange testing (such as labs, EKG, etc.), we will make arrangements to do so. Although advances in technology are sophisticated, we cannot ensure that it will always work on either your end or our end. If the connection with a video visit is poor, the visit may have to be switched to a telephone visit. With either a video or telephone visit, we are not always able to ensure that we have a secure connection.  By engaging in this virtual visit, you consent to the provision of healthcare and authorize for your insurance to be billed (if applicable) for the services provided during this visit. Depending on your insurance coverage, you may receive a charge related to this service.  I need to obtain your verbal consent now. Are you willing to proceed with your visit today? Tyler Hobbs has provided verbal consent on 08/21/2023 for a virtual visit (video or telephone). Tyler Hobbs, [place]  Date: 08/21/2023 8:55 AM  Virtual Visit via Telephone Note   I, Tyler Hobbs, connected with  Tyler Hobbs  ([id number], 09-21-1981) on 08/21/23 at  8:30 AM EDT by a telephone and verified that I am speaking with the correct person using two identifiers.  Location: Patient: Virtual Visit Location Patient: Home Provider: Virtual Visit  Location Provider: Office/Clinic   I discussed the limitations of evaluation and management by telemedicine and the availability of in person appointments. The patient expressed understanding and agreed to proceed.    History of Present Illness: Tyler Hobbs is a 42 y.o. who identifies as a male who was assigned male at birth, and is being seen today for covid 19 infection.  HPI: 42 y/o M presents to the clinic for testing positive for covid yesterday. His spouse tested positive two days before. He currently states that "I'm out of breath with talking too". Denies body aches, but has mild fever and just feels horrible. Denies coughing as bad as his spouse. +h/o seasonal asthma for which he uses Albuterol inhaler prn.   URI     Problems:  Patient Active Problem List   Diagnosis Date Noted   Left elbow pain 05/30/2023   Back pain 02/16/2022   Epigastric pain 01/28/2022   GAD (generalized anxiety disorder) 11/02/2020   Educated about COVID-19 virus infection 11/18/2019   Seasonal allergies 04/10/2019   Gastroesophageal reflux disease 01/21/2019   Elevated blood pressure reading 05/16/2018   OSA (obstructive sleep apnea) 11/14/2016   Asthma, chronic 09/02/2015   Adult attention deficit disorder 09/02/2015   Annual physical exam 09/02/2015    Allergies: No Known Allergies Medications:  Current Outpatient Medications:    albuterol (VENTOLIN HFA) 108 (90 Base) MCG/ACT inhaler, Inhale 2 puffs into the lungs every 6 (six) hours as needed for wheezing or shortness of breath., Disp: 8 g, Rfl: 1  celecoxib (CELEBREX) 100 MG capsule, Take 100 mg by mouth 2 (two) times daily., Disp: , Rfl:    cyclobenzaprine (FLEXERIL) 5 MG tablet, Take 1 tablet (5 mg total) by mouth at bedtime., Disp: 10 tablet, Rfl: 0   fluticasone (FLONASE) 50 MCG/ACT nasal spray, Place 2 sprays into both nostrils daily., Disp: 16 g, Rfl: 6   loratadine (CLARITIN) 10 MG tablet, Take 10 mg by mouth daily., Disp: , Rfl:     methylphenidate (RITALIN) 10 MG tablet, 1.5 tablets in the morning, 1.5 tablets at noon and 1 tablet in the evening., Disp: 120 tablet, Rfl: 0   methylphenidate (RITALIN) 10 MG tablet, 1.5 tablets in the am, 1.5 tablets at noon, and 1 tablet in the evening., Disp: 120 tablet, Rfl: 0   methylphenidate (RITALIN) 10 MG tablet, 1.5 tablets in the am, 1.5 tablets at noon, and 1 tablet in the evening, Disp: 120 tablet, Rfl: 0   methylPREDNISolone (MEDROL DOSEPAK) 4 MG TBPK tablet, Take as directed, Disp: 1 each, Rfl: 0   Multiple Vitamins-Minerals (MULTIVITAMIN GUMMIES ADULTS) CHEW, Chew by mouth 2 (two) times daily., Disp: , Rfl:    pantoprazole (PROTONIX) 20 MG tablet, Take 20-40mg  PO one hour prior to breakfast prn as needed., Disp: 180 tablet, Rfl: 3   sulfaSALAzine (AZULFIDINE) 500 MG EC tablet, 1 Tab Daily for 7 days, 1 tab twice daily for 7 day, 2 tabs twice daily with food, Disp: , Rfl:   Observations/Objective: Resting comfortably at home.  No labored breathing.  Speech is clear and coherent with logical content.  Patient is alert and oriented at baseline.    Assessment and Plan: 1. COVID-19 virus infection  2. Bronchospasm - methylPREDNISolone (MEDROL DOSEPAK) 4 MG TBPK tablet; Take as directed  Dispense: 1 each; Refill: 0 - albuterol (VENTOLIN HFA) 108 (90 Base) MCG/ACT inhaler; Inhale 2 puffs into the lungs every 6 (six) hours as needed for wheezing or shortness of breath.  Dispense: 8 g; Refill: 1  Stay well hydrated Take medicines as prescribed Continue to watch for worsening symptoms. Reviewed CDC guidelines with patient. Pt will be isolated for 5 days from the start of symptoms. Needs to be fever free for 24 hours without the aid of medicine before returning back to work. Must wear a mask for an additional 5 days when at workplace.    Follow Up Instructions: I discussed the assessment and treatment plan with the patient. The patient was provided an opportunity to ask questions  and all were answered. The patient agreed with the plan and demonstrated an understanding of the instructions.  A copy of instructions were sent to the patient via MyChart unless otherwise noted below.    The patient was advised to call back or seek an in-person evaluation if the symptoms worsen or if the condition fails to improve as anticipated.  Time:  I spent 10 minutes with the patient via telehealth technology discussing the above problems/concerns.    Tyler Better, PA-C

## 2023-08-25 ENCOUNTER — Other Ambulatory Visit: Payer: Self-pay | Admitting: Family

## 2023-08-25 DIAGNOSIS — F988 Other specified behavioral and emotional disorders with onset usually occurring in childhood and adolescence: Secondary | ICD-10-CM

## 2023-08-25 MED ORDER — METHYLPHENIDATE HCL 10 MG PO TABS
ORAL_TABLET | ORAL | 0 refills | Status: DC
Start: 2023-08-25 — End: 2023-09-19

## 2023-09-12 NOTE — Telephone Encounter (Signed)
LVM to call back to schedule appt to continue to get refills

## 2023-09-19 ENCOUNTER — Other Ambulatory Visit: Payer: Self-pay | Admitting: Family

## 2023-09-19 DIAGNOSIS — F988 Other specified behavioral and emotional disorders with onset usually occurring in childhood and adolescence: Secondary | ICD-10-CM

## 2023-09-19 MED ORDER — METHYLPHENIDATE HCL 10 MG PO TABS
ORAL_TABLET | ORAL | 0 refills | Status: DC
Start: 2023-09-19 — End: 2023-12-15

## 2023-09-20 ENCOUNTER — Encounter: Payer: Self-pay | Admitting: Family

## 2023-09-20 ENCOUNTER — Ambulatory Visit: Payer: BC Managed Care – PPO | Admitting: Family

## 2023-09-20 VITALS — BP 130/80 | HR 91 | Temp 98.5°F | Resp 17 | Ht 72.0 in | Wt 198.1 lb

## 2023-09-20 DIAGNOSIS — M545 Low back pain, unspecified: Secondary | ICD-10-CM

## 2023-09-20 DIAGNOSIS — R1013 Epigastric pain: Secondary | ICD-10-CM | POA: Diagnosis not present

## 2023-09-20 DIAGNOSIS — Z125 Encounter for screening for malignant neoplasm of prostate: Secondary | ICD-10-CM | POA: Diagnosis not present

## 2023-09-20 DIAGNOSIS — F988 Other specified behavioral and emotional disorders with onset usually occurring in childhood and adolescence: Secondary | ICD-10-CM | POA: Diagnosis not present

## 2023-09-20 DIAGNOSIS — Z136 Encounter for screening for cardiovascular disorders: Secondary | ICD-10-CM

## 2023-09-20 DIAGNOSIS — G8929 Other chronic pain: Secondary | ICD-10-CM

## 2023-09-20 DIAGNOSIS — Z13220 Encounter for screening for lipoid disorders: Secondary | ICD-10-CM | POA: Diagnosis not present

## 2023-09-20 DIAGNOSIS — Z8639 Personal history of other endocrine, nutritional and metabolic disease: Secondary | ICD-10-CM

## 2023-09-20 MED ORDER — PANTOPRAZOLE SODIUM 20 MG PO TBEC
DELAYED_RELEASE_TABLET | ORAL | 3 refills | Status: DC
Start: 2023-09-20 — End: 2024-03-15

## 2023-09-20 NOTE — Progress Notes (Signed)
Assessment & Plan:  Adult attention deficit disorder Assessment & Plan: Chronic, stable.  Continue Ritalin 15 mg qam, 15mg  at noon and 10mg  in the afternoon.   Orders: -     Comprehensive metabolic panel  Epigastric pain -     Pantoprazole Sodium; Take 20-40mg  PO one hour prior to breakfast prn as needed.  Dispense: 180 tablet; Refill: 3  Encounter for lipid screening for cardiovascular disease -     Lipid panel  Screening for prostate cancer -     PSA  History of vitamin D deficiency -     VITAMIN D 25 Hydroxy (Vit-D Deficiency, Fractures)  Chronic midline low back pain, unspecified whether sciatica present -     CBC with Differential/Platelet     Return precautions given.   Risks, benefits, and alternatives of the medications and treatment plan prescribed today were discussed, and patient expressed understanding.   Education regarding symptom management and diagnosis given to patient on AVS either electronically or printed.  No follow-ups on file.  Rennie Plowman, FNP  Subjective:    Patient ID: Tyler Hobbs, male    DOB: 03/02/1981, 42 y.o.   MRN: [id number]  CC: Tyler Hobbs is a 42 y.o. male who presents today for follow up.   HPI: Feels well today.  No new complaints  Ritalin is working well. No increased anxiety or trouble sleeping.       Allergies: Patient has no known allergies. Current Outpatient Medications on File Prior to Visit  Medication Sig Dispense Refill   albuterol (VENTOLIN HFA) 108 (90 Base) MCG/ACT inhaler Inhale 2 puffs into the lungs every 6 (six) hours as needed for wheezing or shortness of breath. 8 g 1   celecoxib (CELEBREX) 100 MG capsule Take 100 mg by mouth 2 (two) times daily.     Elderberry-Vitamin C-Zinc (ELDERBERRY IMMUNE HEALTH GUMMY PO) Take by mouth.     fluticasone (FLONASE) 50 MCG/ACT nasal spray Place 2 sprays into both nostrils daily. 16 g 6   folic acid (FOLVITE) 1 MG tablet 1 mg daily.     loratadine  (CLARITIN) 10 MG tablet Take 10 mg by mouth daily.     methylphenidate (RITALIN) 10 MG tablet 1.5 tablets in the am, 1.5 tablets at noon, and 1 tablet in the evening. 120 tablet 0   methylphenidate (RITALIN) 10 MG tablet 1.5 tablets in the morning, 1.5 tablets at noon and 1 tablet in the evening. 120 tablet 0   methylphenidate (RITALIN) 10 MG tablet 1.5 tablets in the am, 1.5 tablets at noon, and 1 tablet in the evening 120 tablet 0   Multiple Vitamins-Minerals (MULTIVITAMIN GUMMIES ADULTS) CHEW Chew by mouth 2 (two) times daily.     sulfaSALAzine (AZULFIDINE) 500 MG EC tablet 1 Tab Daily for 7 days, 1 tab twice daily for 7 day, 2 tabs twice daily with food     No current facility-administered medications on file prior to visit.    Review of Systems  Constitutional:  Negative for chills and fever.  Respiratory:  Negative for cough.   Cardiovascular:  Negative for chest pain and palpitations.  Gastrointestinal:  Negative for nausea and vomiting.      Objective:    BP 130/80   Pulse 91   Temp 98.5 F (36.9 C) (Oral)   Resp 17   Ht 6' (1.829 m)   Wt 198 lb 2 oz (89.9 kg)   SpO2 98%   BMI 26.87 kg/m  BP Readings from Last 3  Encounters:  09/20/23 130/80  05/30/23 134/76  02/21/23 (!) 140/90   Wt Readings from Last 3 Encounters:  09/20/23 198 lb 2 oz (89.9 kg)  05/30/23 200 lb (90.7 kg)  12/06/22 199 lb 6.4 oz (90.4 kg)    Physical Exam Vitals reviewed.  Constitutional:      Appearance: He is well-developed.  Cardiovascular:     Rate and Rhythm: Regular rhythm.     Heart sounds: Normal heart sounds.  Pulmonary:     Effort: Pulmonary effort is normal. No respiratory distress.     Breath sounds: Normal breath sounds. No wheezing, rhonchi or rales.  Skin:    General: Skin is warm and dry.  Neurological:     Mental Status: He is alert.  Psychiatric:        Speech: Speech normal.        Behavior: Behavior normal.

## 2023-09-20 NOTE — Assessment & Plan Note (Signed)
Chronic, stable.  Continue Ritalin 15 mg qam, 15mg  at noon and 10mg  in the afternoon.

## 2023-09-20 NOTE — Patient Instructions (Signed)
Labs at [facility] in January 2025.  Nice to see you!

## 2023-10-06 DIAGNOSIS — Z7960 Long term (current) use of unspecified immunomodulators and immunosuppressants: Secondary | ICD-10-CM | POA: Diagnosis not present

## 2023-10-06 DIAGNOSIS — M45A0 Non-radiographic axial spondyloarthritis of unspecified sites in spine: Secondary | ICD-10-CM | POA: Diagnosis not present

## 2023-10-08 IMAGING — DX DG LUMBAR SPINE COMPLETE 4+V
5 series · 5 of 5 positions shown · non-contrast
Comparison: CT abdomen and pelvis 01/24/2022

CLINICAL DATA: Lower back pain.  Evaluate axial spondylarthritis.

EXAM:
LUMBAR SPINE - COMPLETE 4+ VIEW

[lumbar spine ap]
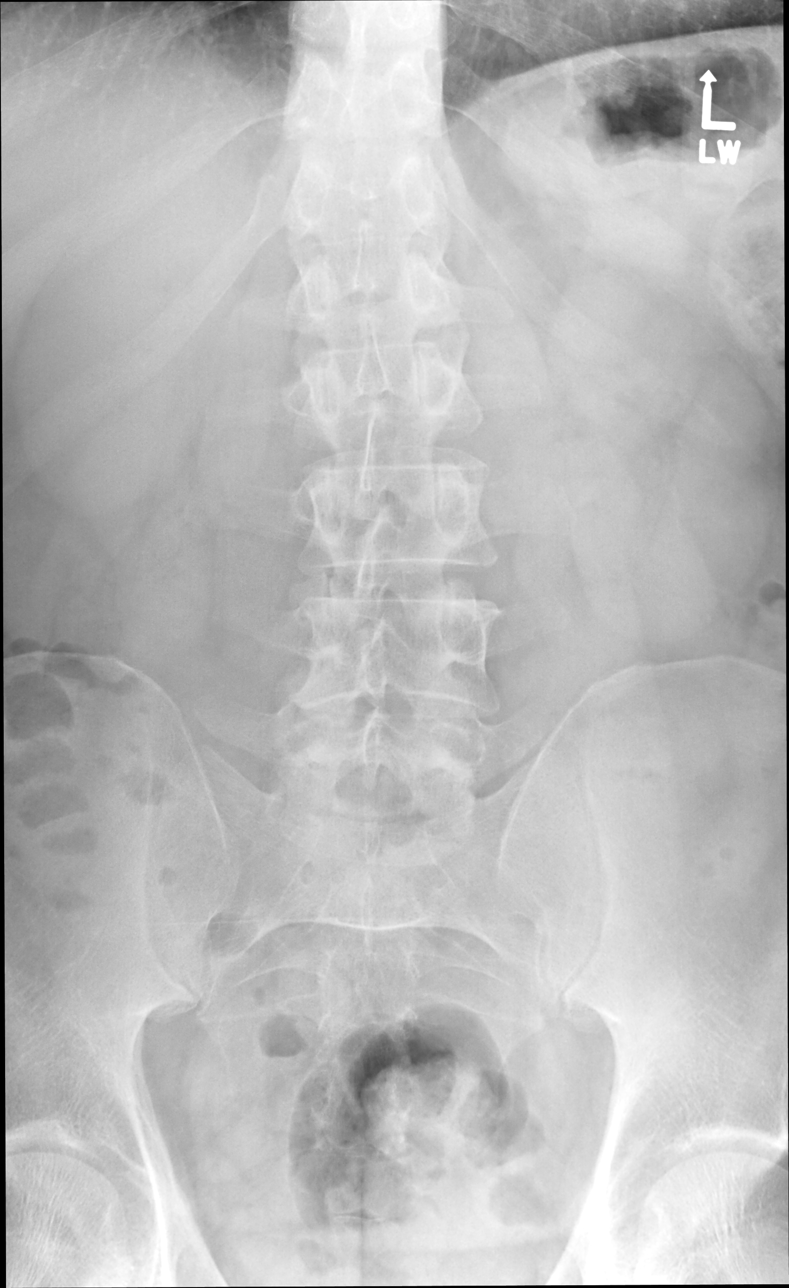

[lumbar spine obl (oblique) (1 of 2)]
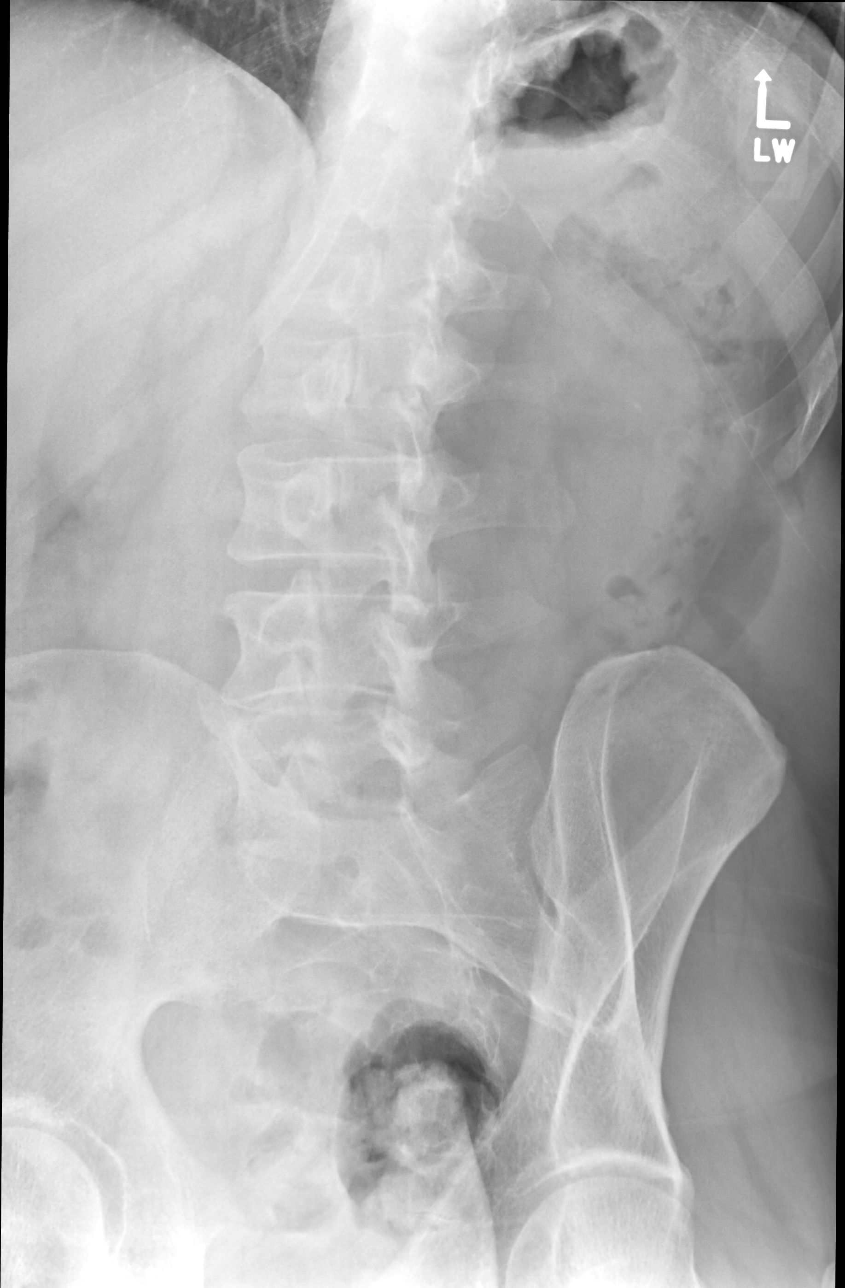

[lumbar spine obl (oblique) (2 of 2)]
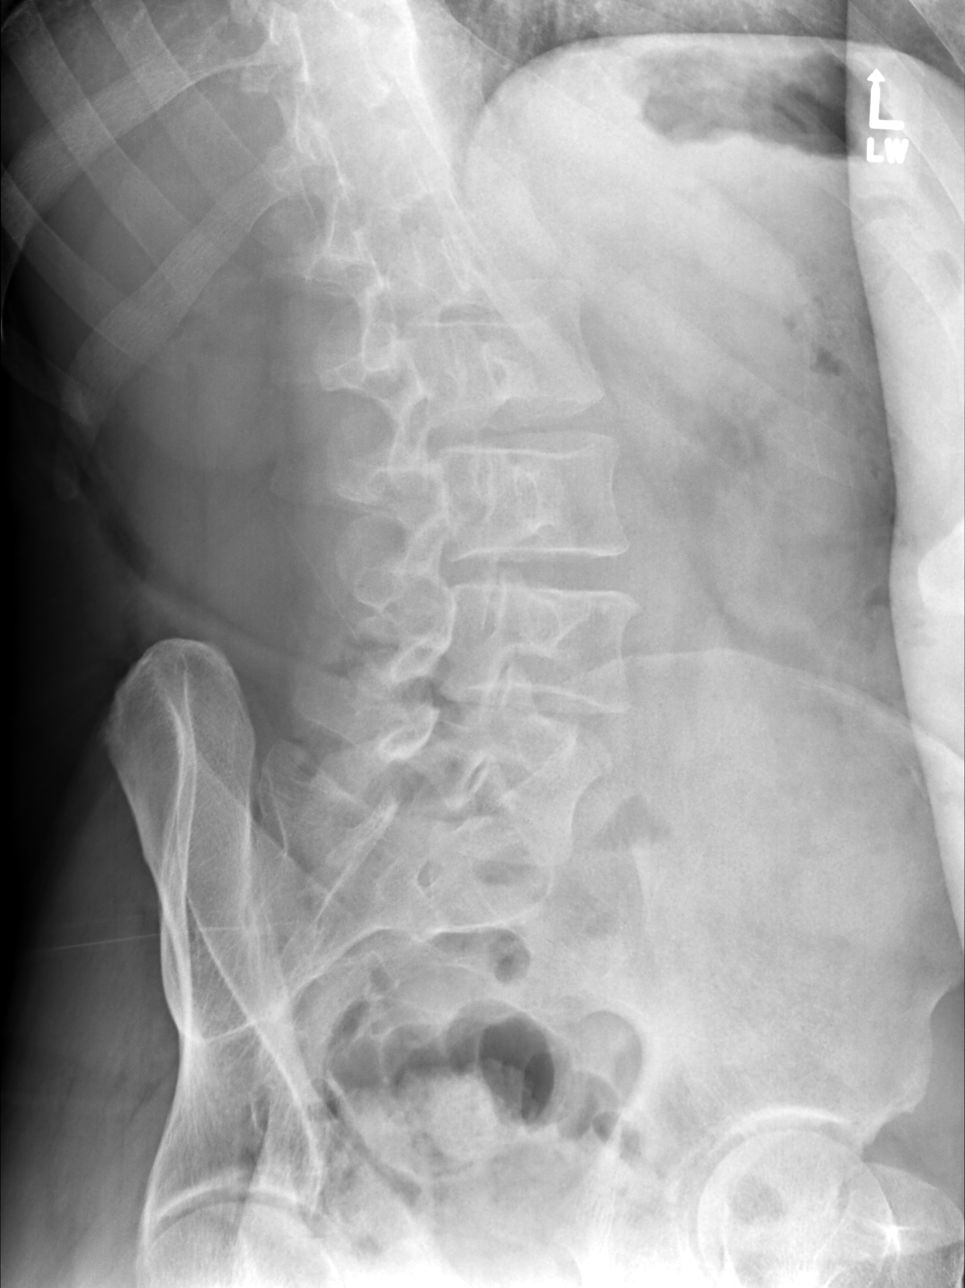

[lumbar spine lat]
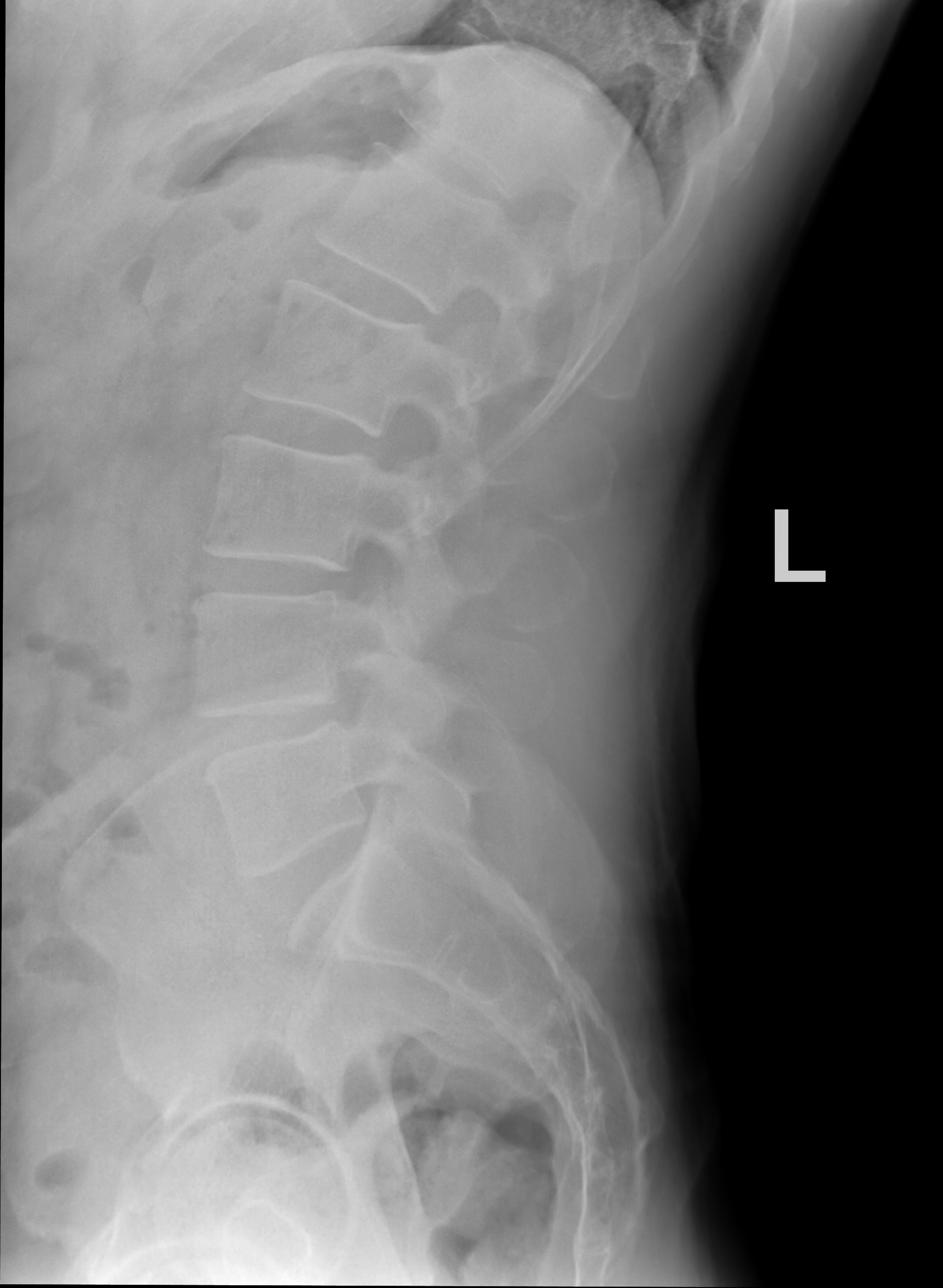

[lumbar spot lat]
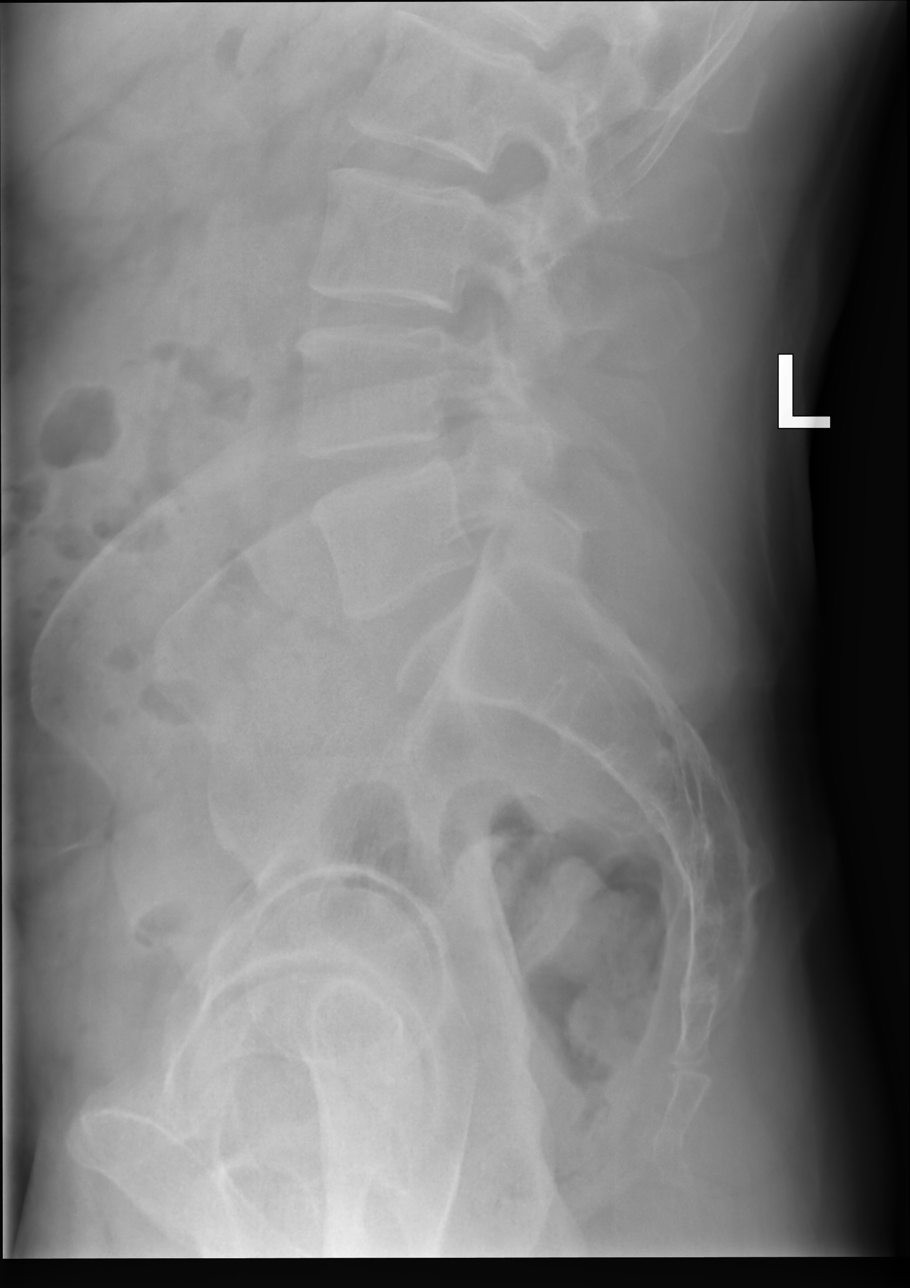

[5 of 5 positions shown; findings below may reference images not displayed]

FINDINGS: There are 5 non-rib-bearing lumbar-type vertebral bodies. Minimal
levocurvature centered at L3. Normal sagittal alignment. Vertebral
body heights are maintained. The lumbar disc spaces are preserved.
Bilateral sacroiliac joint spaces are maintained.
IMPRESSION: Mild levocurvature centered at L3. No significant degenerative disc
change.

## 2023-12-06 ENCOUNTER — Other Ambulatory Visit: Payer: Self-pay

## 2023-12-06 DIAGNOSIS — Z8639 Personal history of other endocrine, nutritional and metabolic disease: Secondary | ICD-10-CM

## 2023-12-06 DIAGNOSIS — Z125 Encounter for screening for malignant neoplasm of prostate: Secondary | ICD-10-CM

## 2023-12-06 DIAGNOSIS — Z13220 Encounter for screening for lipoid disorders: Secondary | ICD-10-CM

## 2023-12-06 DIAGNOSIS — G8929 Other chronic pain: Secondary | ICD-10-CM

## 2023-12-06 DIAGNOSIS — R1013 Epigastric pain: Secondary | ICD-10-CM

## 2023-12-06 DIAGNOSIS — Z136 Encounter for screening for cardiovascular disorders: Secondary | ICD-10-CM

## 2023-12-06 DIAGNOSIS — F988 Other specified behavioral and emotional disorders with onset usually occurring in childhood and adolescence: Secondary | ICD-10-CM

## 2023-12-08 ENCOUNTER — Other Ambulatory Visit: Payer: BC Managed Care – PPO

## 2023-12-08 DIAGNOSIS — R1013 Epigastric pain: Secondary | ICD-10-CM

## 2023-12-08 DIAGNOSIS — M545 Low back pain, unspecified: Secondary | ICD-10-CM

## 2023-12-08 DIAGNOSIS — Z125 Encounter for screening for malignant neoplasm of prostate: Secondary | ICD-10-CM

## 2023-12-08 DIAGNOSIS — Z8639 Personal history of other endocrine, nutritional and metabolic disease: Secondary | ICD-10-CM

## 2023-12-08 DIAGNOSIS — Z13220 Encounter for screening for lipoid disorders: Secondary | ICD-10-CM

## 2023-12-08 DIAGNOSIS — F988 Other specified behavioral and emotional disorders with onset usually occurring in childhood and adolescence: Secondary | ICD-10-CM

## 2023-12-09 LAB — LIPID PANEL

## 2023-12-11 ENCOUNTER — Encounter: Payer: Self-pay | Admitting: Family

## 2023-12-11 LAB — COMPREHENSIVE METABOLIC PANEL
ALT: 18 IU/L (ref 0–44)
AST: 17 IU/L (ref 0–40)
Albumin: 4.6 g/dL (ref 4.1–5.1)
Alkaline Phosphatase: 60 IU/L (ref 44–121)
BUN/Creatinine Ratio: 19 (ref 9–20)
BUN: 17 mg/dL (ref 6–24)
Bilirubin Total: 0.4 mg/dL (ref 0.0–1.2)
CO2: 26 mmol/L (ref 20–29)
Calcium: 9.6 mg/dL (ref 8.7–10.2)
Chloride: 104 mmol/L (ref 96–106)
Creatinine, Ser: 0.91 mg/dL (ref 0.76–1.27)
Globulin, Total: 1.8 g/dL (ref 1.5–4.5)
Glucose: 98 mg/dL (ref 70–99)
Potassium: 4.2 mmol/L (ref 3.5–5.2)
Sodium: 142 mmol/L (ref 134–144)
Total Protein: 6.4 g/dL (ref 6.0–8.5)
eGFR: 107 mL/min/{1.73_m2} (ref >59)

## 2023-12-11 LAB — CBC WITH DIFFERENTIAL/PLATELET
Basophils Absolute: 0.1 10*3/uL (ref 0.0–0.2)
Basos: 1 %
EOS (ABSOLUTE): 0.2 10*3/uL (ref 0.0–0.4)
Eos: 3 %
Hematocrit: 46.2 % (ref 37.5–51.0)
Hemoglobin: 15.4 g/dL (ref 13.0–17.7)
Immature Grans (Abs): 0.1 10*3/uL (ref 0.0–0.1)
Immature Granulocytes: 1 %
Lymphocytes Absolute: 2.1 10*3/uL (ref 0.7–3.1)
Lymphs: 31 %
MCH: 32.8 pg (ref 26.6–33.0)
MCHC: 33.3 g/dL (ref 31.5–35.7)
MCV: 98 fL — ABNORMAL HIGH (ref 79–97)
Monocytes Absolute: 0.7 10*3/uL (ref 0.1–0.9)
Monocytes: 10 %
Neutrophils Absolute: 3.8 10*3/uL (ref 1.4–7.0)
Neutrophils: 54 %
Platelets: 232 10*3/uL (ref 150–450)
RBC: 4.7 x10E6/uL (ref 4.14–5.80)
RDW: 11.8 % (ref 11.6–15.4)
WBC: 7 10*3/uL (ref 3.4–10.8)

## 2023-12-11 LAB — LIPID PANEL
Cholesterol, Total: 166 mg/dL (ref 100–199)
HDL: 51 mg/dL (ref >39)
LDL CALC COMMENT:: 3.3 ratio (ref 0.0–5.0)
LDL Chol Calc (NIH): 102 mg/dL — ABNORMAL HIGH (ref 0–99)
Triglycerides: 69 mg/dL (ref 0–149)
VLDL Cholesterol Cal: 13 mg/dL (ref 5–40)

## 2023-12-11 LAB — VITAMIN D 25 HYDROXY (VIT D DEFICIENCY, FRACTURES): Vit D, 25-Hydroxy: 38.3 ng/mL (ref 30.0–100.0)

## 2023-12-11 LAB — PSA: Prostate Specific Ag, Serum: 0.8 ng/mL (ref 0.0–4.0)

## 2023-12-13 ENCOUNTER — Other Ambulatory Visit (HOSPITAL_COMMUNITY): Payer: Self-pay

## 2023-12-14 ENCOUNTER — Encounter: Payer: Self-pay | Admitting: Family

## 2023-12-14 DIAGNOSIS — R899 Unspecified abnormal finding in specimens from other organs, systems and tissues: Secondary | ICD-10-CM

## 2023-12-15 ENCOUNTER — Other Ambulatory Visit: Payer: Self-pay

## 2023-12-15 ENCOUNTER — Other Ambulatory Visit: Payer: Self-pay | Admitting: Family

## 2023-12-15 DIAGNOSIS — F988 Other specified behavioral and emotional disorders with onset usually occurring in childhood and adolescence: Secondary | ICD-10-CM

## 2023-12-15 MED ORDER — METHYLPHENIDATE HCL 10 MG PO TABS: ORAL_TABLET | ORAL | 0 refills | Status: DC

## 2023-12-22 ENCOUNTER — Other Ambulatory Visit (HOSPITAL_COMMUNITY): Payer: Self-pay

## 2023-12-22 ENCOUNTER — Telehealth: Payer: Self-pay

## 2023-12-22 NOTE — Telephone Encounter (Signed)
Per test claim, both meds are currently covered under current insurance. If pt is changing insurance, we will need his new insurance information in order to run a test claim. Currently, no PA can be submitted as both of these meds are covered (we can try again Jan. 1st), would highly recommend pt pick up the meds prior to January 1st so that he has ample time for a PA to be done as sometimes these can take a while. Thanks.

## 2023-12-22 NOTE — Telephone Encounter (Signed)
Pharmacy Patient Advocate Encounter   Received notification from Patient Advice Request messages that prior authorization for Pantoprazole 20mg  is required/requested.   Insurance verification completed.   The patient is insured through [facility] .   Per test claim: The current 30 day co-pay is, $6.02.  No PA needed at this time. This test claim was processed through [facility]- copay amounts may vary at other pharmacies due to pharmacy/plan contracts, or as the patient moves through the different stages of their insurance plan.

## 2023-12-22 NOTE — Telephone Encounter (Signed)
Pharmacy Patient Advocate Encounter   Received notification from Pt Calls Messages that prior authorization for Methylphenidate 10mg  is required/requested.   Insurance verification completed.   The patient is insured through [facility] .   Per test claim: The current 30 day co-pay is, $12.00.  No PA needed at this time. This test claim was processed through [facility]- copay amounts may vary at other pharmacies due to pharmacy/plan contracts, or as the patient moves through the different stages of their insurance plan.

## 2023-12-26 ENCOUNTER — Other Ambulatory Visit: Payer: Self-pay | Admitting: Family

## 2024-01-03 NOTE — Addendum Note (Signed)
 Addended by: [place], Jamol Ginyard on: 01/03/2024 11:16 AM   Modules accepted: Orders

## 2024-01-03 NOTE — Telephone Encounter (Signed)
 Orders faxed to [facility] @  [phone].

## 2024-01-05 ENCOUNTER — Other Ambulatory Visit: Payer: BC Managed Care – PPO

## 2024-01-05 DIAGNOSIS — R899 Unspecified abnormal finding in specimens from other organs, systems and tissues: Secondary | ICD-10-CM

## 2024-01-06 LAB — CBC WITH DIFFERENTIAL/PLATELET
Basophils Absolute: 0.1 10*3/uL (ref 0.0–0.2)
Basos: 1 %
EOS (ABSOLUTE): 0.1 10*3/uL (ref 0.0–0.4)
Eos: 2 %
Hematocrit: 47.2 % (ref 37.5–51.0)
Hemoglobin: 15.6 g/dL (ref 13.0–17.7)
Immature Grans (Abs): 0 10*3/uL (ref 0.0–0.1)
Immature Granulocytes: 0 %
Lymphocytes Absolute: 2.8 10*3/uL (ref 0.7–3.1)
Lymphs: 45 %
MCH: 32.6 pg (ref 26.6–33.0)
MCHC: 33.1 g/dL (ref 31.5–35.7)
MCV: 99 fL — ABNORMAL HIGH (ref 79–97)
Monocytes Absolute: 0.6 10*3/uL (ref 0.1–0.9)
Monocytes: 10 %
Neutrophils Absolute: 2.7 10*3/uL (ref 1.4–7.0)
Neutrophils: 42 %
Platelets: 265 10*3/uL (ref 150–450)
RBC: 4.79 x10E6/uL (ref 4.14–5.80)
RDW: 11.7 % (ref 11.6–15.4)
WBC: 6.3 10*3/uL (ref 3.4–10.8)

## 2024-01-16 ENCOUNTER — Other Ambulatory Visit (HOSPITAL_COMMUNITY): Payer: Self-pay

## 2024-01-16 ENCOUNTER — Telehealth: Payer: Self-pay

## 2024-01-16 NOTE — Telephone Encounter (Signed)
Pharmacy Patient Advocate Encounter   Received notification from Patient Advice Request messages that prior authorization for Methylphenidate HCl 10MG  tablets is required/requested.   Insurance verification completed.   The patient is insured through [facility] .   Per test claim: PA required; PA submitted to above mentioned insurance via CoverMyMeds Key/confirmation #/EOC [id number] Status is pending

## 2024-01-17 ENCOUNTER — Other Ambulatory Visit (HOSPITAL_COMMUNITY): Payer: Self-pay

## 2024-01-17 NOTE — Telephone Encounter (Signed)
Pharmacy Patient Advocate Encounter  Received notification from [facility] that Prior Authorization for Methylphenidate HCl 10MG  tablets  has been APPROVED from 01/16/24 to 07/15/24. Ran test claim, Copay is $12. This test claim was processed through [facility]- copay amounts may vary at other pharmacies due to pharmacy/plan contracts, or as the patient moves through the different stages of their insurance plan.   PA #/Case ID/Reference #: [id number]  *spoke with [facility] pharmacy to process

## 2024-01-18 ENCOUNTER — Ambulatory Visit: Payer: BC Managed Care – PPO | Admitting: Family

## 2024-01-19 NOTE — Telephone Encounter (Signed)
Noted

## 2024-01-22 NOTE — Telephone Encounter (Signed)
SEE TELEPHONE NOTE FROM 01/17/24

## 2024-01-29 ENCOUNTER — Ambulatory Visit: Payer: BC Managed Care – PPO | Admitting: Family

## 2024-01-29 ENCOUNTER — Encounter: Payer: Self-pay | Admitting: Family

## 2024-01-29 VITALS — BP 128/82 | HR 81 | Temp 98.3°F | Ht 72.0 in | Wt 196.0 lb

## 2024-01-29 DIAGNOSIS — R899 Unspecified abnormal finding in specimens from other organs, systems and tissues: Secondary | ICD-10-CM

## 2024-01-29 DIAGNOSIS — F988 Other specified behavioral and emotional disorders with onset usually occurring in childhood and adolescence: Secondary | ICD-10-CM

## 2024-01-29 NOTE — Assessment & Plan Note (Signed)
Chronic, stable.  Continue Ritalin 15 mg qam, 15mg  at noon and 10mg  in the afternoon.

## 2024-01-29 NOTE — Progress Notes (Signed)
Assessment & Plan:  Abnormal laboratory test -     B12 and Folate Panel  Adult attention deficit disorder Assessment & Plan: Chronic, stable.  Continue Ritalin 15 mg qam, 15mg  at noon and 10mg  in the afternoon.       Return precautions given.   Risks, benefits, and alternatives of the medications and treatment plan prescribed today were discussed, and patient expressed understanding.   Education regarding symptom management and diagnosis given to patient on AVS either electronically or printed.  Return in about 4 months (around 05/28/2024).  Rennie Plowman, FNP  Subjective:    Patient ID: Tyler Hobbs, male    DOB: [birth year], 43 y.o.   MRN: [id number]  CC: Thong Feeny is a 43 y.o. male who presents today for follow up.   HPI: Feels well today No new complaints  He is doing well on Ritalin and feels dose is adequate.  Denies palpitations, increased anxiety   He is taking multi vitamin with b12,  Compliant with folic acid 1mg  qd Allergies: Patient has no known allergies. Current Outpatient Medications on File Prior to Visit  Medication Sig Dispense Refill   albuterol (VENTOLIN HFA) 108 (90 Base) MCG/ACT inhaler Inhale 2 puffs into the lungs every 6 (six) hours as needed for wheezing or shortness of breath. 8 g 1   celecoxib (CELEBREX) 100 MG capsule Take 100 mg by mouth daily.     Elderberry-Vitamin C-Zinc (ELDERBERRY IMMUNE HEALTH GUMMY PO) Take by mouth.     fluticasone (FLONASE) 50 MCG/ACT nasal spray Place 2 sprays into both nostrils daily. 16 g 6   folic acid (FOLVITE) 1 MG tablet 1 mg daily.     loratadine (CLARITIN) 10 MG tablet Take 10 mg by mouth daily.     methylphenidate (RITALIN) 10 MG tablet 1.5 tablets in the morning, 1.5 tablets at noon and 1 tablet in the evening. 120 tablet 0   methylphenidate (RITALIN) 10 MG tablet 1.5 tablets in the am, 1.5 tablets at noon, and 1 tablet in the evening 120 tablet 0   methylphenidate (RITALIN) 10 MG tablet 1.5  tablets in the am, 1.5 tablets at noon, and 1 tablet in the evening. 120 tablet 0   Multiple Vitamins-Minerals (MULTIVITAMIN GUMMIES ADULTS) CHEW Chew by mouth 2 (two) times daily.     pantoprazole (PROTONIX) 20 MG tablet Take 20-40mg  PO one hour prior to breakfast prn as needed. 180 tablet 3   sulfaSALAzine (AZULFIDINE) 500 MG EC tablet 1 Tab Daily for 7 days, 1 tab twice daily for 7 day, 2 tabs twice daily with food     No current facility-administered medications on file prior to visit.    Review of Systems  Constitutional:  Negative for chills and fever.  Respiratory:  Negative for cough.   Cardiovascular:  Negative for chest pain and palpitations.  Gastrointestinal:  Negative for nausea and vomiting.      Objective:    BP 128/82   Pulse 81   Temp 98.3 F (36.8 C) (Oral)   Ht 6' (1.829 m)   Wt 196 lb (88.9 kg)   SpO2 99%   BMI 26.58 kg/m  BP Readings from Last 3 Encounters:  01/29/24 128/82  09/20/23 130/80  05/30/23 134/76   Wt Readings from Last 3 Encounters:  01/29/24 196 lb (88.9 kg)  09/20/23 198 lb 2 oz (89.9 kg)  05/30/23 200 lb (90.7 kg)    Physical Exam Vitals reviewed.  Constitutional:      Appearance: He is  well-developed.  Cardiovascular:     Rate and Rhythm: Regular rhythm.     Heart sounds: Normal heart sounds.  Pulmonary:     Effort: Pulmonary effort is normal. No respiratory distress.     Breath sounds: Normal breath sounds. No wheezing, rhonchi or rales.  Skin:    General: Skin is warm and dry.  Neurological:     Mental Status: He is alert.  Psychiatric:        Speech: Speech normal.        Behavior: Behavior normal.

## 2024-02-06 DIAGNOSIS — M45A0 Non-radiographic axial spondyloarthritis of unspecified sites in spine: Secondary | ICD-10-CM | POA: Diagnosis not present

## 2024-02-06 DIAGNOSIS — Z7960 Long term (current) use of unspecified immunomodulators and immunosuppressants: Secondary | ICD-10-CM | POA: Diagnosis not present

## 2024-02-12 ENCOUNTER — Other Ambulatory Visit: Payer: Self-pay

## 2024-02-12 DIAGNOSIS — R899 Unspecified abnormal finding in specimens from other organs, systems and tissues: Secondary | ICD-10-CM

## 2024-02-13 ENCOUNTER — Other Ambulatory Visit: Payer: BC Managed Care – PPO

## 2024-02-13 DIAGNOSIS — R899 Unspecified abnormal finding in specimens from other organs, systems and tissues: Secondary | ICD-10-CM

## 2024-02-15 ENCOUNTER — Encounter: Payer: Self-pay | Admitting: Family

## 2024-02-15 LAB — B12 AND FOLATE PANEL
Folate: >20 ng/mL (ref >3.0)
Vitamin B-12: 719 pg/mL (ref 232–1245)

## 2024-02-27 ENCOUNTER — Other Ambulatory Visit: Payer: Self-pay | Admitting: Family

## 2024-02-27 ENCOUNTER — Encounter: Payer: Self-pay | Admitting: Family

## 2024-02-27 DIAGNOSIS — F988 Other specified behavioral and emotional disorders with onset usually occurring in childhood and adolescence: Secondary | ICD-10-CM

## 2024-02-27 MED ORDER — METHYLPHENIDATE HCL 10 MG PO TABS: ORAL_TABLET | ORAL | 0 refills | Status: DC

## 2024-02-27 NOTE — Progress Notes (Signed)
 close

## 2024-03-02 DIAGNOSIS — G4733 Obstructive sleep apnea (adult) (pediatric): Secondary | ICD-10-CM | POA: Diagnosis not present

## 2024-03-15 ENCOUNTER — Encounter: Payer: Self-pay | Admitting: Family

## 2024-03-15 DIAGNOSIS — R1013 Epigastric pain: Secondary | ICD-10-CM

## 2024-03-15 DIAGNOSIS — F988 Other specified behavioral and emotional disorders with onset usually occurring in childhood and adolescence: Secondary | ICD-10-CM

## 2024-03-15 NOTE — Telephone Encounter (Signed)
 Lov: 01/29/24  Nov: 05/07/24        E-Prescribing Status: Receipt confirmed by pharmacy (02/27/2024  3:50 PM EST)

## 2024-03-18 ENCOUNTER — Other Ambulatory Visit: Payer: Self-pay | Admitting: Family

## 2024-03-18 DIAGNOSIS — F988 Other specified behavioral and emotional disorders with onset usually occurring in childhood and adolescence: Secondary | ICD-10-CM

## 2024-03-18 DIAGNOSIS — R1013 Epigastric pain: Secondary | ICD-10-CM

## 2024-03-18 MED ORDER — METHYLPHENIDATE HCL 10 MG PO TABS: ORAL_TABLET | ORAL | 0 refills | Status: DC

## 2024-03-18 MED ORDER — PANTOPRAZOLE SODIUM 20 MG PO TBEC: 40.0000 mg | DELAYED_RELEASE_TABLET | Freq: Every day | ORAL | 3 refills | Status: DC

## 2024-03-18 MED ORDER — METHYLPHENIDATE HCL 10 MG PO TABS
ORAL_TABLET | ORAL | 0 refills | Status: DC
Start: 2024-03-18 — End: 2024-07-17

## 2024-03-20 ENCOUNTER — Other Ambulatory Visit (HOSPITAL_COMMUNITY): Payer: Self-pay

## 2024-03-20 ENCOUNTER — Telehealth: Payer: Self-pay

## 2024-03-20 DIAGNOSIS — Z3141 Encounter for fertility testing: Secondary | ICD-10-CM | POA: Diagnosis not present

## 2024-03-20 NOTE — Telephone Encounter (Signed)
 Pharmacy Patient Advocate Encounter   Received notification from RX Request Messages that prior authorization for Pantoprazole 20 mg is required/requested.   Insurance verification completed.   The patient is insured through  [facility]  .   Per test claim:  Pantoprazole 40mg  is preferred by the insurance.  If suggested medication is appropriate, Please send in a new RX and discontinue this one. If not, please advise as to why it's not appropriate so that we may request a Prior Authorization. Please note, some preferred medications may still require a PA.  If the suggested medications have not been trialed and there are no contraindications to their use, the PA will not be submitted, as it will not be approved.

## 2024-03-20 NOTE — Telephone Encounter (Signed)
 Per test claim:  Pantoprazole 40mg  is preferred by the insurance.  If suggested medication is appropriate, Please send in a new RX and discontinue this one. If not, please advise as to why it's not appropriate so that we may request a Prior Authorization. Please note, some preferred medications may still require a PA.  If the suggested medications have not been trialed and there are no contraindications to their use, the PA will not be submitted, as it will not be approved.

## 2024-03-25 ENCOUNTER — Other Ambulatory Visit: Payer: Self-pay | Admitting: Family

## 2024-03-25 DIAGNOSIS — R1013 Epigastric pain: Secondary | ICD-10-CM

## 2024-03-25 MED ORDER — PANTOPRAZOLE SODIUM 20 MG PO TBEC: 20.0000 mg | DELAYED_RELEASE_TABLET | Freq: Every day | ORAL | 3 refills | Status: DC

## 2024-03-27 ENCOUNTER — Other Ambulatory Visit: Payer: Self-pay | Admitting: Family

## 2024-03-27 DIAGNOSIS — R1013 Epigastric pain: Secondary | ICD-10-CM

## 2024-03-27 MED ORDER — PANTOPRAZOLE SODIUM 20 MG PO TBEC: 20.0000 mg | DELAYED_RELEASE_TABLET | Freq: Every day | ORAL | 3 refills | Status: AC

## 2024-04-02 ENCOUNTER — Other Ambulatory Visit (HOSPITAL_COMMUNITY): Payer: Self-pay

## 2024-04-02 DIAGNOSIS — G4733 Obstructive sleep apnea (adult) (pediatric): Secondary | ICD-10-CM | POA: Diagnosis not present

## 2024-04-16 ENCOUNTER — Other Ambulatory Visit: Payer: Self-pay

## 2024-04-16 ENCOUNTER — Encounter: Payer: Self-pay | Admitting: Oncology

## 2024-04-16 ENCOUNTER — Ambulatory Visit (INDEPENDENT_AMBULATORY_CARE_PROVIDER_SITE_OTHER): Payer: Self-pay | Admitting: Oncology

## 2024-04-16 VITALS — BP 134/83 | HR 70 | Temp 97.3°F | Ht 72.0 in | Wt 173.0 lb

## 2024-04-16 DIAGNOSIS — R051 Acute cough: Secondary | ICD-10-CM

## 2024-04-16 MED ORDER — PREDNISONE 10 MG (21) PO TBPK: ORAL_TABLET | ORAL | 0 refills | Status: DC

## 2024-04-16 MED ORDER — AZITHROMYCIN 250 MG PO TABS
ORAL_TABLET | ORAL | 0 refills | Status: AC
Start: 2024-04-16 — End: 2024-04-21

## 2024-04-16 NOTE — Progress Notes (Signed)
 [facility]  [address] [place], [place] [address] Phone: [phone] Fax: [phone]   Office Visit Note  Patient Name: Tyler Hobbs  Date of [id number]  Med Rec number [id number]  Date of Service: 04/16/2024  Patient has no known allergies.  Chief Complaint  Patient presents with   Cough    Patient c/o cough with phlegm, wheezing, and SOB. He also reports having intermittent nasal congestion but states he has seasonal allergies he used to get allergy shots regularly for before moving to [place]. Symptoms began 4 days ago. He takes loratadine and Flonase  daily.   HPI Patient is an 43 y.o. patient who is here for cough with phlegm production, nasal congestion and wheezing that started on Saturday.  Reports he has severe known environmental allergies and takes routinely antihistamines, Flonase  and uses his albuterol  inhaler as needed. No fevers. No sick contacts. Cough drops and med have not helped. Cough is bad all day. Having some burning in his esophagus. Mild discomfort in both ears.  NKDA. No recent anitbiotics.   Current Medication:  Outpatient Encounter Medications as of 04/16/2024  Medication Sig   albuterol  (VENTOLIN  HFA) 108 (90 Base) MCG/ACT inhaler Inhale 2 puffs into the lungs every 6 (six) hours as needed for wheezing or shortness of breath.   azithromycin  (ZITHROMAX ) 250 MG tablet Take 2 tablets on day 1, then 1 tablet daily on days 2 through 5   celecoxib (CELEBREX) 100 MG capsule Take 100 mg by mouth every other day.   Elderberry-Vitamin C-Zinc (ELDERBERRY IMMUNE HEALTH GUMMY PO) Take by mouth daily.   fluticasone  (FLONASE ) 50 MCG/ACT nasal spray Place 2 sprays into both nostrils daily.   folic acid (FOLVITE) 1 MG tablet 1 mg daily.   loratadine (CLARITIN) 10 MG tablet Take 10 mg by mouth daily. Alternates monthly with cetirizine   methylphenidate  (RITALIN ) 10 MG tablet 1.5 tablets in the morning, 1.5 tablets at noon and 1 tablet in the evening.    methylphenidate  (RITALIN ) 10 MG tablet 1.5 tablets in the am, 1.5 tablets at noon, and 1 tablet in the evening.   methylphenidate  (RITALIN ) 10 MG tablet 1.5 tablets in the am, 1.5 tablets at noon, and 1 tablet in the evening   Multiple Vitamins-Minerals (MULTIVITAMIN GUMMIES ADULTS) CHEW Chew by mouth 2 (two) times daily.   pantoprazole  (PROTONIX ) 20 MG tablet Take 1-2 tablets (20-40 mg total) by mouth daily.   predniSONE  (STERAPRED UNI-PAK 21 TAB) 10 MG (21) TBPK tablet Take 6 days on day 1, 5 tabs on day 2 and decrease by one tab daily until gone.   sulfaSALAzine (AZULFIDINE) 500 MG EC tablet 1 Tab Daily for 7 days, 1 tab twice daily for 7 day, 2 tabs twice daily with food   No facility-administered encounter medications on file as of 04/16/2024.      Medical History: Past Medical History:  Diagnosis Date   Asthma    Chicken pox    Hay fever    Multiple allergies    OSA (obstructive sleep apnea)      Vital Signs: BP 134/83   Pulse 70   Temp (!) 97.3 F (36.3 C)   Ht 6' (1.829 m)   Wt 173 lb (78.5 kg)   SpO2 98%   BMI 23.46 kg/m   ROS: As per HPI.  All other pertinent ROS negative.     Review of Systems  Constitutional:  Positive for fatigue. Negative for chills, diaphoresis and fever.  HENT:  Positive  for congestion, ear pain, postnasal drip, sinus pressure, sinus pain and sore throat.   Respiratory:  Positive for cough, shortness of breath and wheezing.   Gastrointestinal:  Negative for nausea and rectal pain.  Musculoskeletal:  Negative for myalgias.  Allergic/Immunologic: Positive for environmental allergies.  Neurological:  Negative for dizziness.    Physical Exam Constitutional:      Appearance: Normal appearance.  HENT:     Right Ear: Tympanic membrane normal.     Left Ear: Tympanic membrane normal.     Nose: Rhinorrhea present.     Mouth/Throat:     Pharynx: Posterior oropharyngeal erythema present. No oropharyngeal exudate.  Pulmonary:     Effort:  Prolonged expiration present.     Breath sounds: Decreased air movement present. Examination of the right-upper field reveals decreased breath sounds. Examination of the left-upper field reveals decreased breath sounds. Decreased breath sounds present. No wheezing, rhonchi or rales.     Comments: Coughing throughout exam  Lymphadenopathy:     Head:     Right side of head: No submandibular adenopathy.     Left side of head: No submandibular adenopathy.  Neurological:     Mental Status: He is alert.     No results found for this or any previous visit (from the past 24 hours).  Assessment/Plan: 1. Acute cough (Primary) Exam is concerning for bronchitis.  We discussed treating with azithromycin  250 mg tablets.  Take 2 tabs on day 1 followed by 1 tablet until complete along with prednisone  taper.  You will take 6 tablets on day 1 followed by 5 tabs on day 2 and reduce by 1 tablet until complete.  Patient has tolerated prednisone  well in the past.  Continue OTC allergy medication and may try a cough suppressant such as Delsym.  Please let me know if you are not improving over the next 48 to 72 hours.  We discussed a chest x-ray should things not get better.  - predniSONE  (STERAPRED UNI-PAK 21 TAB) 10 MG (21) TBPK tablet; Take 6 days on day 1, 5 tabs on day 2 and decrease by one tab daily until gone.  Dispense: 21 tablet; Refill: 0 - azithromycin  (ZITHROMAX ) 250 MG tablet; Take 2 tablets on day 1, then 1 tablet daily on days 2 through 5  Dispense: 6 tablet; Refill: 0   General Counseling: Tyler Hobbs understanding of the findings of todays visit and agrees with plan of treatment. I have discussed any further diagnostic evaluation that may be needed or ordered today. We also reviewed his medications today. he has been encouraged to call the office with any questions or concerns that should arise related to todays visit.   No orders of the defined types were placed in this encounter.   Meds  ordered this encounter  Medications   predniSONE  (STERAPRED UNI-PAK 21 TAB) 10 MG (21) TBPK tablet    Sig: Take 6 days on day 1, 5 tabs on day 2 and decrease by one tab daily until gone.    Dispense:  21 tablet    Refill:  0   azithromycin  (ZITHROMAX ) 250 MG tablet    Sig: Take 2 tablets on day 1, then 1 tablet daily on days 2 through 5    Dispense:  6 tablet    Refill:  0    I spent 25 minutes dedicated to the care of this patient (face-to-face and non-face-to-face) on the date of the encounter to include what is described in the assessment and  plan.   Charlton Cooler, NP 04/16/2024 12:36 PM

## 2024-05-02 DIAGNOSIS — G4733 Obstructive sleep apnea (adult) (pediatric): Secondary | ICD-10-CM | POA: Diagnosis not present

## 2024-05-07 ENCOUNTER — Encounter: Payer: Self-pay | Admitting: Family

## 2024-05-07 ENCOUNTER — Ambulatory Visit: Admitting: Family

## 2024-05-07 VITALS — BP 122/82 | HR 80 | Temp 98.0°F | Ht 72.0 in | Wt 196.4 lb

## 2024-05-07 DIAGNOSIS — Z1211 Encounter for screening for malignant neoplasm of colon: Secondary | ICD-10-CM

## 2024-05-07 DIAGNOSIS — F988 Other specified behavioral and emotional disorders with onset usually occurring in childhood and adolescence: Secondary | ICD-10-CM | POA: Diagnosis not present

## 2024-05-07 DIAGNOSIS — R899 Unspecified abnormal finding in specimens from other organs, systems and tissues: Secondary | ICD-10-CM

## 2024-05-07 NOTE — Patient Instructions (Signed)
 Please have non fasting lab for CBC done in the next few weeks to monitor MCV.   Nice to see you!

## 2024-05-07 NOTE — Progress Notes (Signed)
 Assessment & Plan:   Screening for colon cancer -     Ambulatory referral to Gastroenterology  Abnormal laboratory test -     CBC with Differential/Platelet  Adult attention deficit disorder Assessment & Plan: Chronic, stable.  Continue Ritalin  15 mg qam, 15mg  at noon and 10mg  in the afternoon.       Return precautions given.   Risks, benefits, and alternatives of the medications and treatment plan prescribed today were discussed, and patient expressed understanding.   Education regarding symptom management and diagnosis given to patient on AVS either electronically or printed.  No follow-ups on file.  [name], FNP  Subjective:    Patient ID: Tyler Hobbs, male    DOB: Feb 24, 1981, 43 y.o.   MRN: [id number]  CC: Tyler Hobbs is a 43 y.o. male who presents today for follow up.   HPI: Feels well today No new complaints Upcoming MBA graduation  Doing well on ritalin  15mg  dose which he feels is adequate  Denies trouble sleeping, heart palpitations     Allergies: Patient has no known allergies. Current Outpatient Medications on File Prior to Visit  Medication Sig Dispense Refill   albuterol  (VENTOLIN  HFA) 108 (90 Base) MCG/ACT inhaler Inhale 2 puffs into the lungs every 6 (six) hours as needed for wheezing or shortness of breath. 8 g 1   celecoxib (CELEBREX) 100 MG capsule Take 100 mg by mouth every other day.     Elderberry-Vitamin C-Zinc (ELDERBERRY IMMUNE HEALTH GUMMY PO) Take by mouth daily.     fluticasone  (FLONASE ) 50 MCG/ACT nasal spray Place 2 sprays into both nostrils daily. 16 g 6   folic acid (FOLVITE) 1 MG tablet 1 mg daily.     loratadine (CLARITIN) 10 MG tablet Take 10 mg by mouth daily. Alternates monthly with cetirizine     methylphenidate  (RITALIN ) 10 MG tablet 1.5 tablets in the morning, 1.5 tablets at noon and 1 tablet in the evening. 120 tablet 0   methylphenidate  (RITALIN ) 10 MG tablet 1.5 tablets in the am, 1.5 tablets at noon, and  1 tablet in the evening. 120 tablet 0   methylphenidate  (RITALIN ) 10 MG tablet 1.5 tablets in the am, 1.5 tablets at noon, and 1 tablet in the evening 120 tablet 0   Multiple Vitamins-Minerals (MULTIVITAMIN GUMMIES ADULTS) CHEW Chew by mouth 2 (two) times daily.     pantoprazole  (PROTONIX ) 20 MG tablet Take 1-2 tablets (20-40 mg total) by mouth daily. 180 tablet 3   sulfaSALAzine (AZULFIDINE) 500 MG EC tablet 1 Tab Daily for 7 days, 1 tab twice daily for 7 day, 2 tabs twice daily with food     No current facility-administered medications on file prior to visit.    Review of Systems  Constitutional:  Negative for chills and fever.  Respiratory:  Negative for cough.   Cardiovascular:  Negative for chest pain and palpitations.  Gastrointestinal:  Negative for nausea and vomiting.      Objective:    BP 122/82   Pulse 80   Temp 98 F (36.7 C) (Oral)   Ht 6' (1.829 m)   Wt 196 lb 6.4 oz (89.1 kg)   SpO2 97%   BMI 26.64 kg/m  BP Readings from Last 3 Encounters:  05/07/24 122/82  04/16/24 134/83  01/29/24 128/82   Wt Readings from Last 3 Encounters:  05/07/24 196 lb 6.4 oz (89.1 kg)  04/16/24 173 lb (78.5 kg)  01/29/24 196 lb (88.9 kg)    Physical Exam Vitals reviewed.  Constitutional:      Appearance: He is well-developed.  Cardiovascular:     Rate and Rhythm: Regular rhythm.     Heart sounds: Normal heart sounds.  Pulmonary:     Effort: Pulmonary effort is normal. No respiratory distress.     Breath sounds: Normal breath sounds. No wheezing, rhonchi or rales.  Skin:    General: Skin is warm and dry.  Neurological:     Mental Status: He is alert.  Psychiatric:        Speech: Speech normal.        Behavior: Behavior normal.

## 2024-05-07 NOTE — Assessment & Plan Note (Signed)
Chronic, stable.  Continue Ritalin 15 mg qam, 15mg  at noon and 10mg  in the afternoon.

## 2024-05-29 DIAGNOSIS — M45A0 Non-radiographic axial spondyloarthritis of unspecified sites in spine: Secondary | ICD-10-CM | POA: Diagnosis not present

## 2024-05-29 DIAGNOSIS — Z7960 Long term (current) use of unspecified immunomodulators and immunosuppressants: Secondary | ICD-10-CM | POA: Diagnosis not present

## 2024-06-03 ENCOUNTER — Other Ambulatory Visit

## 2024-06-03 DIAGNOSIS — R899 Unspecified abnormal finding in specimens from other organs, systems and tissues: Secondary | ICD-10-CM

## 2024-06-04 LAB — CBC WITH DIFFERENTIAL/PLATELET
Basophils Absolute: 0 10*3/uL (ref 0.0–0.2)
Basos: 1 %
EOS (ABSOLUTE): 0.1 10*3/uL (ref 0.0–0.4)
Eos: 1 %
Hematocrit: 44.8 % (ref 37.5–51.0)
Hemoglobin: 14.8 g/dL (ref 13.0–17.7)
Immature Grans (Abs): 0 10*3/uL (ref 0.0–0.1)
Immature Granulocytes: 0 %
Lymphocytes Absolute: 2.2 10*3/uL (ref 0.7–3.1)
Lymphs: 39 %
MCH: 32.8 pg (ref 26.6–33.0)
MCHC: 33 g/dL (ref 31.5–35.7)
MCV: 99 fL — ABNORMAL HIGH (ref 79–97)
Monocytes Absolute: 0.7 10*3/uL (ref 0.1–0.9)
Monocytes: 12 %
Neutrophils Absolute: 2.6 10*3/uL (ref 1.4–7.0)
Neutrophils: 47 %
Platelets: 205 10*3/uL (ref 150–450)
RBC: 4.51 x10E6/uL (ref 4.14–5.80)
RDW: 11.9 % (ref 11.6–15.4)
WBC: 5.6 10*3/uL (ref 3.4–10.8)

## 2024-06-17 DIAGNOSIS — Z113 Encounter for screening for infections with a predominantly sexual mode of transmission: Secondary | ICD-10-CM | POA: Diagnosis not present

## 2024-06-19 ENCOUNTER — Ambulatory Visit: Payer: Self-pay | Admitting: Family

## 2024-06-27 ENCOUNTER — Encounter: Payer: Self-pay | Admitting: Family

## 2024-07-17 ENCOUNTER — Encounter: Payer: Self-pay | Admitting: Family

## 2024-07-17 ENCOUNTER — Other Ambulatory Visit: Payer: Self-pay | Admitting: Family

## 2024-07-17 DIAGNOSIS — F988 Other specified behavioral and emotional disorders with onset usually occurring in childhood and adolescence: Secondary | ICD-10-CM

## 2024-07-17 MED ORDER — METHYLPHENIDATE HCL 10 MG PO TABS: ORAL_TABLET | ORAL | 0 refills | Status: DC

## 2024-07-17 NOTE — Telephone Encounter (Signed)
 Requesting: ritalin  Contract: No UDS: no Last Visit: 05/07/2024 Next Visit: 08/21/2024 Last Refill: E-Prescribing Status: Receipt confirmed by pharmacy (03/18/2024  2:31 PM EDT)   Please Advise

## 2024-07-23 ENCOUNTER — Other Ambulatory Visit (HOSPITAL_COMMUNITY): Payer: Self-pay

## 2024-07-23 ENCOUNTER — Telehealth: Payer: Self-pay

## 2024-07-23 NOTE — Telephone Encounter (Signed)
 PA request has been Submitted. New Encounter has been or will be created for follow up. For additional info see Pharmacy Prior Auth telephone encounter from 07/23/2024.

## 2024-07-23 NOTE — Telephone Encounter (Signed)
 Pharmacy Patient Advocate Encounter  Received notification from [facility] that Prior Authorization for Methylphenidate  HCl 10MG  tablets has been APPROVED from 07/23/2024 to 01/23/2025. Ran test claim, Copay is $12. This test claim was processed through [facility]- copay amounts may vary at other pharmacies due to pharmacy/plan contracts, or as the patient moves through the different stages of their insurance plan.   PA #/Case ID/Reference #: [id number]

## 2024-07-23 NOTE — Telephone Encounter (Signed)
 Pharmacy Patient Advocate Encounter   Received notification from CoverMyMeds that prior authorization for Methylphenidate  HCl 10MG  tablets is required/requested.   Insurance verification completed.   The patient is insured through [facility] .   Per test claim: PA required; PA submitted to above mentioned insurance via CoverMyMeds Key/confirmation #/EOC [id number] Status is pending

## 2024-07-23 NOTE — Telephone Encounter (Signed)
 Pt notified though mychart of the approval.

## 2024-08-21 ENCOUNTER — Encounter: Payer: Self-pay | Admitting: Family

## 2024-08-21 ENCOUNTER — Ambulatory Visit: Admitting: Family

## 2024-08-21 VITALS — BP 122/78 | HR 98 | Temp 98.4°F | Ht 72.0 in | Wt 192.8 lb

## 2024-08-21 DIAGNOSIS — F988 Other specified behavioral and emotional disorders with onset usually occurring in childhood and adolescence: Secondary | ICD-10-CM

## 2024-08-21 DIAGNOSIS — R899 Unspecified abnormal finding in specimens from other organs, systems and tissues: Secondary | ICD-10-CM | POA: Diagnosis not present

## 2024-08-21 NOTE — Patient Instructions (Signed)
 Nice to see you!

## 2024-08-21 NOTE — Progress Notes (Signed)
 Assessment & Plan:  Abnormal laboratory test -     TSH -     Iron, TIBC and Ferritin Panel -     Hepatic function panel -     CBC with Differential/Platelet  Adult attention deficit disorder Assessment & Plan: Chronic, remains stable.  No changes to regimen. Continue Ritalin  15 mg qam, 15mg  at noon and 10mg  in the afternoon.       Return precautions given.   Risks, benefits, and alternatives of the medications and treatment plan prescribed today were discussed, and patient expressed understanding.   Education regarding symptom management and diagnosis given to patient on AVS either electronically or printed.  No follow-ups on file.  Rollene Northern, FNP  Subjective:    Patient ID: Tyler Hobbs, male    DOB: 26-Apr-1981, 43 y.o.   MRN: [id number]  CC: Tyler Hobbs is a 43 y.o. male who presents today for follow up.   HPI: Feels well today. No new complaints  He is pleased with ritalin  regimen. Dose is working well for him.  He continues to follow with rheumatology.      Allergies: Patient has no known allergies. Current Outpatient Medications on File Prior to Visit  Medication Sig Dispense Refill   albuterol  (VENTOLIN  HFA) 108 (90 Base) MCG/ACT inhaler Inhale 2 puffs into the lungs every 6 (six) hours as needed for wheezing or shortness of breath. 8 g 1   celecoxib (CELEBREX) 100 MG capsule Take 100 mg by mouth 2 (two) times a week.     Elderberry-Vitamin C-Zinc (ELDERBERRY IMMUNE HEALTH GUMMY PO) Take by mouth daily.     fluticasone  (FLONASE ) 50 MCG/ACT nasal spray Place 2 sprays into both nostrils daily. 16 g 6   loratadine (CLARITIN) 10 MG tablet Take 10 mg by mouth daily. Alternates monthly with cetirizine     methylphenidate  (RITALIN ) 10 MG tablet 1.5 tablets in the am, 1.5 tablets at noon, and 1 tablet in the evening. 120 tablet 0   methylphenidate  (RITALIN ) 10 MG tablet 1.5 tablets in the am, 1.5 tablets at noon, and 1 tablet in the evening 120 tablet 0    methylphenidate  (RITALIN ) 10 MG tablet 1.5 tablets in the morning, 1.5 tablets at noon and 1 tablet in the evening. 120 tablet 0   Multiple Vitamins-Minerals (MULTIVITAMIN GUMMIES ADULTS) CHEW Chew by mouth 2 (two) times daily.     pantoprazole  (PROTONIX ) 20 MG tablet Take 1-2 tablets (20-40 mg total) by mouth daily. 180 tablet 3   sulfaSALAzine (AZULFIDINE) 500 MG EC tablet 1 Tab Daily for 7 days, 1 tab twice daily for 7 day, 2 tabs twice daily with food     No current facility-administered medications on file prior to visit.    Review of Systems  Constitutional:  Negative for chills and fever.  Respiratory:  Negative for cough.   Cardiovascular:  Negative for chest pain and palpitations.  Gastrointestinal:  Negative for nausea and vomiting.      Objective:    BP 122/78   Pulse 98   Temp 98.4 F (36.9 C) (Oral)   Ht 6' (1.829 m)   Wt 192 lb 12.8 oz (87.5 kg)   SpO2 98%   BMI 26.15 kg/m  BP Readings from Last 3 Encounters:  08/21/24 122/78  05/07/24 122/82  04/16/24 134/83   Wt Readings from Last 3 Encounters:  08/21/24 192 lb 12.8 oz (87.5 kg)  05/07/24 196 lb 6.4 oz (89.1 kg)  04/16/24 173 lb (78.5 kg)  08/21/2024    9:00 AM 05/07/2024    8:03 AM 01/29/2024   12:26 PM  Depression screen PHQ 2/9  Decreased Interest 0 0 0  Down, Depressed, Hopeless 0 0 0  PHQ - 2 Score 0 0 0    Physical Exam Vitals reviewed.  Constitutional:      Appearance: He is well-developed.  Cardiovascular:     Rate and Rhythm: Regular rhythm.     Heart sounds: Normal heart sounds.  Pulmonary:     Effort: Pulmonary effort is normal. No respiratory distress.     Breath sounds: Normal breath sounds. No wheezing, rhonchi or rales.  Skin:    General: Skin is warm and dry.  Neurological:     Mental Status: He is alert.  Psychiatric:        Speech: Speech normal.        Behavior: Behavior normal.

## 2024-08-21 NOTE — Assessment & Plan Note (Signed)
 Chronic, remains stable.  No changes to regimen. Continue Ritalin  15 mg qam, 15mg  at noon and 10mg  in the afternoon.

## 2024-09-19 ENCOUNTER — Other Ambulatory Visit: Payer: Self-pay

## 2024-09-19 DIAGNOSIS — R899 Unspecified abnormal finding in specimens from other organs, systems and tissues: Secondary | ICD-10-CM

## 2024-09-19 NOTE — Addendum Note (Signed)
 Addended by: NEWCOMER MCCLAIN, Richard Ritchey L on: 09/19/2024 10:07 AM   Modules accepted: Orders

## 2024-09-20 ENCOUNTER — Other Ambulatory Visit

## 2024-09-20 DIAGNOSIS — R899 Unspecified abnormal finding in specimens from other organs, systems and tissues: Secondary | ICD-10-CM

## 2024-09-21 LAB — CBC WITH DIFFERENTIAL/PLATELET
Basophils Absolute: 0.1 x10E3/uL (ref 0.0–0.2)
Basos: 1 %
EOS (ABSOLUTE): 0.2 x10E3/uL (ref 0.0–0.4)
Eos: 4 %
Hematocrit: 44.7 % (ref 37.5–51.0)
Hemoglobin: 15 g/dL (ref 13.0–17.7)
Immature Grans (Abs): 0 x10E3/uL (ref 0.0–0.1)
Immature Granulocytes: 0 %
Lymphocytes Absolute: 2.1 x10E3/uL (ref 0.7–3.1)
Lymphs: 39 %
MCH: 33.6 pg — ABNORMAL HIGH (ref 26.6–33.0)
MCHC: 33.6 g/dL (ref 31.5–35.7)
MCV: 100 fL — ABNORMAL HIGH (ref 79–97)
Monocytes Absolute: 0.6 x10E3/uL (ref 0.1–0.9)
Monocytes: 11 %
Neutrophils Absolute: 2.4 x10E3/uL (ref 1.4–7.0)
Neutrophils: 44 %
Platelets: 201 x10E3/uL (ref 150–450)
RBC: 4.46 x10E6/uL (ref 4.14–5.80)
RDW: 11.9 % (ref 11.6–15.4)
WBC: 5.4 x10E3/uL (ref 3.4–10.8)

## 2024-09-21 LAB — TSH: TSH: 2.15 u[IU]/mL (ref 0.450–4.500)

## 2024-09-21 LAB — HEPATIC FUNCTION PANEL
ALT: 15 IU/L (ref 0–44)
AST: 17 IU/L (ref 0–40)
Albumin: 4.4 g/dL (ref 4.1–5.1)
Alkaline Phosphatase: 62 IU/L (ref 47–123)
Bilirubin Total: 0.6 mg/dL (ref 0.0–1.2)
Bilirubin, Direct: 0.2 mg/dL (ref 0.00–0.40)
Total Protein: 6.3 g/dL (ref 6.0–8.5)

## 2024-09-21 LAB — IRON,TIBC AND FERRITIN PANEL
Ferritin: 288 ng/mL (ref 30–400)
Iron Saturation: 61 % — ABNORMAL HIGH (ref 15–55)
Iron: 145 ug/dL (ref 38–169)
Total Iron Binding Capacity: 236 ug/dL — ABNORMAL LOW (ref 250–450)
UIBC: 91 ug/dL — ABNORMAL LOW (ref 111–343)

## 2024-09-30 DIAGNOSIS — M45A0 Non-radiographic axial spondyloarthritis of unspecified sites in spine: Secondary | ICD-10-CM | POA: Diagnosis not present

## 2024-09-30 DIAGNOSIS — Z7960 Long term (current) use of unspecified immunomodulators and immunosuppressants: Secondary | ICD-10-CM | POA: Diagnosis not present

## 2024-10-10 ENCOUNTER — Encounter: Payer: Self-pay | Admitting: Family

## 2024-10-10 ENCOUNTER — Other Ambulatory Visit: Payer: Self-pay | Admitting: Family

## 2024-10-10 DIAGNOSIS — F988 Other specified behavioral and emotional disorders with onset usually occurring in childhood and adolescence: Secondary | ICD-10-CM

## 2024-10-10 MED ORDER — METHYLPHENIDATE HCL 10 MG PO TABS
ORAL_TABLET | ORAL | 0 refills | Status: DC
Start: 2024-10-10 — End: 2024-11-18

## 2024-10-10 MED ORDER — METHYLPHENIDATE HCL 10 MG PO TABS: ORAL_TABLET | ORAL | 0 refills | Status: DC

## 2024-10-10 MED ORDER — METHYLPHENIDATE HCL 10 MG PO TABS: ORAL_TABLET | ORAL | 0 refills | Status: AC

## 2024-10-23 ENCOUNTER — Ambulatory Visit: Payer: Self-pay | Admitting: Family

## 2024-10-27 ENCOUNTER — Other Ambulatory Visit: Payer: Self-pay | Admitting: Family

## 2024-10-27 DIAGNOSIS — R899 Unspecified abnormal finding in specimens from other organs, systems and tissues: Secondary | ICD-10-CM

## 2024-11-04 ENCOUNTER — Encounter: Payer: Self-pay | Admitting: Oncology

## 2024-11-04 ENCOUNTER — Inpatient Hospital Stay

## 2024-11-04 ENCOUNTER — Inpatient Hospital Stay: Attending: Oncology | Admitting: Oncology

## 2024-11-04 VITALS — BP 132/88 | HR 81 | Temp 98.0°F | Resp 18 | Ht 72.0 in | Wt 193.0 lb

## 2024-11-04 DIAGNOSIS — D7589 Other specified diseases of blood and blood-forming organs: Secondary | ICD-10-CM

## 2024-11-04 DIAGNOSIS — Z79899 Other long term (current) drug therapy: Secondary | ICD-10-CM | POA: Diagnosis not present

## 2024-11-04 DIAGNOSIS — Z87891 Personal history of nicotine dependence: Secondary | ICD-10-CM | POA: Diagnosis not present

## 2024-11-04 DIAGNOSIS — Z1589 Genetic susceptibility to other disease: Secondary | ICD-10-CM | POA: Diagnosis not present

## 2024-11-04 DIAGNOSIS — Z803 Family history of malignant neoplasm of breast: Secondary | ICD-10-CM | POA: Diagnosis not present

## 2024-11-04 DIAGNOSIS — R790 Abnormal level of blood mineral: Secondary | ICD-10-CM

## 2024-11-04 DIAGNOSIS — Z801 Family history of malignant neoplasm of trachea, bronchus and lung: Secondary | ICD-10-CM | POA: Diagnosis not present

## 2024-11-04 LAB — FOLATE: Folate: >20 ng/mL (ref >5.9)

## 2024-11-04 LAB — FERRITIN: Ferritin: 191 ng/mL (ref 24–336)

## 2024-11-04 LAB — CBC (CANCER CENTER ONLY)
HCT: 40.5 % (ref 39.0–52.0)
Hemoglobin: 14.2 g/dL (ref 13.0–17.0)
MCH: 32.6 pg (ref 26.0–34.0)
MCHC: 35.1 g/dL (ref 30.0–36.0)
MCV: 93.1 fL (ref 80.0–100.0)
Platelet Count: 192 K/uL (ref 150–400)
RBC: 4.35 MIL/uL (ref 4.22–5.81)
RDW: 11.8 % (ref 11.5–15.5)
WBC Count: 5.1 K/uL (ref 4.0–10.5)
nRBC: 0 % (ref 0.0–0.2)

## 2024-11-04 LAB — IRON AND TIBC
Iron: 115 ug/dL (ref 45–182)
Saturation Ratios: 44 % — ABNORMAL HIGH (ref 17.9–39.5)
TIBC: 260 ug/dL (ref 250–450)
UIBC: 145 ug/dL

## 2024-11-04 LAB — VITAMIN B12: Vitamin B-12: 575 pg/mL (ref 180–914)

## 2024-11-04 LAB — LACTATE DEHYDROGENASE: LDH: 106 U/L (ref 98–192)

## 2024-11-04 NOTE — Progress Notes (Signed)
 [facility]  [phone] [phone]  ID: Tyler Hobbs OB: 25-Jan-1981  MR#: [id number]  RDW#:[id number]  Patient Care Team: Dineen Rollene MATSU, FNP as PCP - General (Family Medicine)  CHIEF COMPLAINT: Macrocytosis:  INTERVAL HISTORY: The patient is a 43 year old male who is noted to have a persistent macrocytosis without anemia with routine blood work.  He currently feels well and is asymptomatic.  He has no neurologic complaints.  He denies any recent fevers or illnesses.  He has a good appetite and denies weight loss.  He does not complain of any weakness or fatigue.  He has no neurological plaints.  He denies any chest pain, shortness of breath, cough, or hemoptysis.  He denies any nausea, vomiting, constipation, or diarrhea.  He has no urinary complaints.  Patient offers no specific complaints today.  REVIEW OF SYSTEMS:   Review of Systems  Constitutional: Negative.  Negative for fever, malaise/fatigue and weight loss.  Respiratory: Negative.  Negative for cough, hemoptysis and shortness of breath.   Cardiovascular: Negative.  Negative for chest pain and leg swelling.  Gastrointestinal: Negative.  Negative for abdominal pain, blood in stool and melena.  Genitourinary: Negative.  Negative for dysuria.  Musculoskeletal: Negative.  Negative for back pain.  Skin: Negative.  Negative for rash.  Neurological: Negative.  Negative for dizziness, focal weakness, weakness and headaches.  Psychiatric/Behavioral: Negative.  The patient is not nervous/anxious.     As per HPI. Otherwise, a complete review of systems is negative.  PAST MEDICAL HISTORY: Past Medical History:  Diagnosis Date   Asthma    Chicken pox    Hay fever    Multiple allergies    OSA (obstructive sleep apnea)     PAST SURGICAL HISTORY: Past Surgical History:  Procedure Laterality Date   ADENOIDECTOMY     TYMPANOSTOMY TUBE PLACEMENT      FAMILY HISTORY: Family History   Problem Relation Age of Onset   Hypertension Father    Lupus Sister    Arthritis Maternal Grandfather    Cancer Maternal Aunt        lung   Cancer Paternal Aunt        breast   Alcohol abuse Paternal Uncle    Colon cancer Neg Hx    Prostate cancer Neg Hx     ADVANCED DIRECTIVES (Y/N):  N  HEALTH MAINTENANCE: Social History   Tobacco Use   Smoking status: Former    Current packs/day: 0.00    Types: Cigarettes    Quit date: 09/01/2001    Years since quitting: 23.1   Smokeless tobacco: Never  Vaping Use   Vaping status: Unknown  Substance Use Topics   Alcohol use: Yes    Alcohol/week: 3.0 - 4.0 standard drinks of alcohol    Types: 3 - 4 Standard drinks or equivalent per week    Comment: both beer and liquor   Drug use: Never    Comment: weed. extacy.     Colonoscopy:  PAP:  Bone density:  Lipid panel:  No Known Allergies  Current Outpatient Medications  Medication Sig Dispense Refill   albuterol  (VENTOLIN  HFA) 108 (90 Base) MCG/ACT inhaler Inhale 2 puffs into the lungs every 6 (six) hours as needed for wheezing or shortness of breath. 8 g 1   celecoxib (CELEBREX) 100 MG capsule Take 100 mg by mouth 2 (two) times a week.     Elderberry-Vitamin C-Zinc (ELDERBERRY IMMUNE HEALTH GUMMY PO) Take by mouth daily.  fluticasone  (FLONASE ) 50 MCG/ACT nasal spray Place 2 sprays into both nostrils daily. 16 g 6   loratadine (CLARITIN) 10 MG tablet Take 10 mg by mouth daily. Alternates monthly with cetirizine     methylphenidate  (RITALIN ) 10 MG tablet 1.5 tablets in the am, 1.5 tablets at noon, and 1 tablet in the evening 120 tablet 0   methylphenidate  (RITALIN ) 10 MG tablet 1.5 tablets in the morning, 1.5 tablets at noon and 1 tablet in the evening. 120 tablet 0   methylphenidate  (RITALIN ) 10 MG tablet 1.5 tablets in the am, 1.5 tablets at noon, and 1 tablet in the evening. 120 tablet 0   Multiple Vitamins-Minerals (MULTIVITAMIN GUMMIES ADULTS) CHEW Chew by mouth 2 (two) times  daily.     pantoprazole  (PROTONIX ) 20 MG tablet Take 1-2 tablets (20-40 mg total) by mouth daily. 180 tablet 3   sulfaSALAzine (AZULFIDINE) 500 MG EC tablet 1 Tab Daily for 7 days, 1 tab twice daily for 7 day, 2 tabs twice daily with food     No current facility-administered medications for this visit.    OBJECTIVE: Vitals:   11/04/24 1111  BP: 132/88  Pulse: 81  Resp: 18  Temp: 98 F (36.7 C)  SpO2: 100%     Body mass index is 26.18 kg/m.    ECOG FS:0 - Asymptomatic  General: Well-developed, well-nourished, no acute distress. Eyes: Pink conjunctiva, anicteric sclera. HEENT: Normocephalic, moist mucous membranes. Lungs: No audible wheezing or coughing. Heart: Regular rate and rhythm. Abdomen: Soft, nontender, no obvious distention. Musculoskeletal: No edema, cyanosis, or clubbing. Neuro: Alert, answering all questions appropriately. Cranial nerves grossly intact. Skin: No rashes or petechiae noted. Psych: Normal affect. Lymphatics: No cervical, calvicular, axillary or inguinal LAD.   LAB RESULTS:  Lab Results  Component Value Date   NA 142 12/08/2023   K 4.2 12/08/2023   CL 104 12/08/2023   CO2 26 12/08/2023   GLUCOSE 98 12/08/2023   BUN 17 12/08/2023   CREATININE 0.91 12/08/2023   CALCIUM 9.6 12/08/2023   PROT 6.3 09/20/2024   ALBUMIN 4.4 09/20/2024   AST 17 09/20/2024   ALT 15 09/20/2024   ALKPHOS 62 09/20/2024   BILITOT 0.6 09/20/2024   GFRNONAA 114 07/03/2020   GFRAA 132 07/03/2020    Lab Results  Component Value Date   WBC 5.1 11/04/2024   NEUTROABS 2.4 09/20/2024   HGB 14.2 11/04/2024   HCT 40.5 11/04/2024   MCV 93.1 11/04/2024   PLT 192 11/04/2024     STUDIES: No results found.  ASSESSMENT: Macrocytosis.  PLAN:    Macrocytosis: Patient's hemoglobin as well as MCV are within normal limits today.  He does not appear to have any hemolysis.  Repeat folate and B12 as well as iron stores are pending at time of dictation.  Myeloid NGS was  ordered for completeness.  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  No intervention is needed.  Return to clinic in 4 weeks for further evaluation and discussion of his laboratory results. Elevated iron saturation level: Patient's most recent iron saturation was reported 61%.  Repeat iron stores pending at time of dictation.  Have also ordered hemochromatosis gene mutation for completeness.  I spent a total of 45 minutes reviewing chart data, face-to-face evaluation with the patient, counseling and coordination of care as detailed above.   Patient expressed understanding and was in agreement with this plan. He also understands that He can call clinic  at any time with any questions, concerns, or complaints.    Evalene JINNY Reusing, MD   11/04/2024 12:26 PM

## 2024-11-04 NOTE — Progress Notes (Signed)
 Patient isn't having any symptoms as of right now.

## 2024-11-05 LAB — HAPTOGLOBIN: Haptoglobin: 100 mg/dL (ref 23–355)

## 2024-11-08 LAB — HEMOCHROMATOSIS DNA-PCR(C282Y,H63D)

## 2024-11-13 NOTE — Telephone Encounter (Signed)
 open in error

## 2024-11-15 LAB — MYELOID NGS

## 2024-11-17 ENCOUNTER — Encounter: Payer: Self-pay | Admitting: Oncology

## 2024-11-17 ENCOUNTER — Encounter: Payer: Self-pay | Admitting: Family

## 2024-11-18 ENCOUNTER — Other Ambulatory Visit: Payer: Self-pay | Admitting: Family

## 2024-11-18 DIAGNOSIS — F988 Other specified behavioral and emotional disorders with onset usually occurring in childhood and adolescence: Secondary | ICD-10-CM

## 2024-11-18 MED ORDER — METHYLPHENIDATE HCL 10 MG PO TABS: ORAL_TABLET | ORAL | 0 refills | Status: AC

## 2024-11-18 MED ORDER — METHYLPHENIDATE HCL 10 MG PO TABS
ORAL_TABLET | ORAL | 0 refills | Status: DC
Start: 2024-11-18 — End: 2024-11-18

## 2024-11-18 NOTE — Telephone Encounter (Signed)
 Noted

## 2024-12-02 ENCOUNTER — Inpatient Hospital Stay: Attending: Oncology | Admitting: Oncology

## 2024-12-02 ENCOUNTER — Encounter: Payer: Self-pay | Admitting: Oncology

## 2024-12-02 VITALS — BP 130/90 | HR 80 | Temp 97.3°F | Resp 18 | Ht 72.0 in

## 2024-12-02 DIAGNOSIS — D7589 Other specified diseases of blood and blood-forming organs: Secondary | ICD-10-CM | POA: Insufficient documentation

## 2024-12-02 DIAGNOSIS — R790 Abnormal level of blood mineral: Secondary | ICD-10-CM | POA: Insufficient documentation

## 2024-12-02 NOTE — Progress Notes (Signed)
 [facility]  [phone] [phone]  ID: Tyler Hobbs OB: Jul 17, 1981  MR#: [id number]  RDW#:[id number]  Patient Care Team: Dineen Rollene MATSU, FNP as PCP - General (Family Medicine) Jacobo Evalene PARAS, MD as Consulting Physician (Oncology)  CHIEF COMPLAINT: Macrocytosis.  INTERVAL HISTORY: Patient returns to clinic today for further evaluation and discussion of his laboratory results.  He continues to feel well and remains asymptomatic.  He has no neurologic complaints.  He denies any recent fevers or illnesses.  He has a good appetite and denies weight loss.  He does not complain of any weakness or fatigue.  He has no neurologic complaints.  He denies any chest pain, shortness of breath, cough, or hemoptysis.  He denies any nausea, vomiting, constipation, or diarrhea.  He has no urinary complaints.  Patient offers no specific complaints today.  REVIEW OF SYSTEMS:   Review of Systems  Constitutional: Negative.  Negative for fever, malaise/fatigue and weight loss.  Respiratory: Negative.  Negative for cough, hemoptysis and shortness of breath.   Cardiovascular: Negative.  Negative for chest pain and leg swelling.  Gastrointestinal: Negative.  Negative for abdominal pain, blood in stool and melena.  Genitourinary: Negative.  Negative for dysuria.  Musculoskeletal: Negative.  Negative for back pain.  Skin: Negative.  Negative for rash.  Neurological: Negative.  Negative for dizziness, focal weakness, weakness and headaches.  Psychiatric/Behavioral: Negative.  The patient is not nervous/anxious.     As per HPI. Otherwise, a complete review of systems is negative.  PAST MEDICAL HISTORY: Past Medical History:  Diagnosis Date   Asthma    Chicken pox    Hay fever    Multiple allergies    OSA (obstructive sleep apnea)     PAST SURGICAL HISTORY: Past Surgical History:  Procedure Laterality Date   ADENOIDECTOMY     TYMPANOSTOMY TUBE PLACEMENT       FAMILY HISTORY: Family History  Problem Relation Age of Onset   Hypertension Father    Lupus Sister    Arthritis Maternal Grandfather    Cancer Maternal Aunt        lung   Cancer Paternal Aunt        breast   Alcohol abuse Paternal Uncle    Colon cancer Neg Hx    Prostate cancer Neg Hx     ADVANCED DIRECTIVES (Y/N):  N  HEALTH MAINTENANCE: Social History   Tobacco Use   Smoking status: Former    Current packs/day: 0.00    Types: Cigarettes    Quit date: 09/01/2001    Years since quitting: 23.2   Smokeless tobacco: Never  Vaping Use   Vaping status: Unknown  Substance Use Topics   Alcohol use: Yes    Alcohol/week: 3.0 - 4.0 standard drinks of alcohol    Types: 3 - 4 Standard drinks or equivalent per week    Comment: both beer and liquor   Drug use: Never    Comment: weed. extacy.     Colonoscopy:  PAP:  Bone density:  Lipid panel:  No Known Allergies  Current Outpatient Medications  Medication Sig Dispense Refill   albuterol  (VENTOLIN  HFA) 108 (90 Base) MCG/ACT inhaler Inhale 2 puffs into the lungs every 6 (six) hours as needed for wheezing or shortness of breath. 8 g 1   celecoxib (CELEBREX) 100 MG capsule Take 100 mg by mouth 2 (two) times a week.     Elderberry-Vitamin C-Zinc (ELDERBERRY IMMUNE HEALTH GUMMY PO) Take by mouth daily.  fluticasone  (FLONASE ) 50 MCG/ACT nasal spray Place 2 sprays into both nostrils daily. 16 g 6   loratadine (CLARITIN) 10 MG tablet Take 10 mg by mouth daily. Alternates monthly with cetirizine     methylphenidate  (RITALIN ) 10 MG tablet 1.5 tablets in the morning, 1.5 tablets at noon and 1 tablet in the evening. 120 tablet 0   methylphenidate  (RITALIN ) 10 MG tablet 1.5 tablets in the am, 1.5 tablets at noon, and 1 tablet in the evening. 120 tablet 0   methylphenidate  (RITALIN ) 10 MG tablet 1.5 tablets in the am, 1.5 tablets at noon, and 1 tablet in the evening 120 tablet 0   Multiple Vitamins-Minerals (MULTIVITAMIN GUMMIES  ADULTS) CHEW Chew by mouth 2 (two) times daily.     pantoprazole  (PROTONIX ) 20 MG tablet Take 1-2 tablets (20-40 mg total) by mouth daily. 180 tablet 3   sulfaSALAzine (AZULFIDINE) 500 MG EC tablet 1 Tab Daily for 7 days, 1 tab twice daily for 7 day, 2 tabs twice daily with food     No current facility-administered medications for this visit.    OBJECTIVE: Vitals:   12/02/24 1028 12/02/24 1032  BP: (!) 133/93 (!) 130/90  Pulse: 80   Resp: 18   Temp: (!) 97.3 F (36.3 C)   SpO2: 100%      Body mass index is 26.18 kg/m.    ECOG FS:0 - Asymptomatic  General: Well-developed, well-nourished, no acute distress. Eyes: Pink conjunctiva, anicteric sclera. HEENT: Normocephalic, moist mucous membranes. Lungs: No audible wheezing or coughing. Heart: Regular rate and rhythm. Abdomen: Soft, nontender, no obvious distention. Musculoskeletal: No edema, cyanosis, or clubbing. Neuro: Alert, answering all questions appropriately. Cranial nerves grossly intact. Skin: No rashes or petechiae noted. Psych: Normal affect.  LAB RESULTS:  Lab Results  Component Value Date   NA 142 12/08/2023   K 4.2 12/08/2023   CL 104 12/08/2023   CO2 26 12/08/2023   GLUCOSE 98 12/08/2023   BUN 17 12/08/2023   CREATININE 0.91 12/08/2023   CALCIUM 9.6 12/08/2023   PROT 6.3 09/20/2024   ALBUMIN 4.4 09/20/2024   AST 17 09/20/2024   ALT 15 09/20/2024   ALKPHOS 62 09/20/2024   BILITOT 0.6 09/20/2024   GFRNONAA 114 07/03/2020   GFRAA 132 07/03/2020    Lab Results  Component Value Date   WBC 5.1 11/04/2024   NEUTROABS 2.4 09/20/2024   HGB 14.2 11/04/2024   HCT 40.5 11/04/2024   MCV 93.1 11/04/2024   PLT 192 11/04/2024     STUDIES: No results found.  ASSESSMENT: Macrocytosis.  PLAN:    Macrocytosis: Patient's most recent hemoglobin as well as MCV are within normal limits today.  All of his other laboratory work is either negative or within normal limits.  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  No events is needed.  Patient does not require bone marrow biopsy.  No further follow-up has been scheduled.   Elevated iron saturation level: Patient most recent saturation level is 44%.  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  No further intervention is needed.    Patient expressed understanding and was in agreement with this plan. He also understands that He can call clinic at any time with any questions, concerns, or complaints.    Evalene JINNY Reusing, MD   12/02/2024 10:44 AM

## 2024-12-02 NOTE — Progress Notes (Signed)
 Patient is dealing with the passing of his grandmother in [place] , so his BP was more elevated today than it has ever been.

## 2024-12-03 ENCOUNTER — Encounter: Payer: Self-pay | Admitting: Family

## 2024-12-03 DIAGNOSIS — D7589 Other specified diseases of blood and blood-forming organs: Secondary | ICD-10-CM | POA: Insufficient documentation

## 2025-02-13 ENCOUNTER — Ambulatory Visit: Admitting: Family
